# Patient Record
Sex: Male | Born: 1990 | Hispanic: No | Marital: Single
Health system: Southern US, Community
[De-identification: ages and names within clinical notes are randomized; demographics above are authoritative.]

---
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
## 2014-08-03 ENCOUNTER — Emergency Department (HOSPITAL_COMMUNITY)
Admission: EM | Admit: 2014-08-03 | Discharge: 2014-08-03 | Disposition: A | Discharge status: Home / Self Care | Payer: Self-pay | Attending: Emergency Medicine | Admitting: Emergency Medicine

## 2014-08-03 ENCOUNTER — Encounter (HOSPITAL_COMMUNITY): Payer: Self-pay | Admitting: Emergency Medicine

## 2014-08-03 ENCOUNTER — Emergency Department (HOSPITAL_COMMUNITY): Payer: Self-pay

## 2014-08-03 DIAGNOSIS — IMO0002 Reserved for concepts with insufficient information to code with codable children: Secondary | ICD-10-CM

## 2014-08-03 DIAGNOSIS — Y9389 Activity, other specified: Secondary | ICD-10-CM | POA: Insufficient documentation

## 2014-08-03 DIAGNOSIS — Y998 Other external cause status: Secondary | ICD-10-CM | POA: Insufficient documentation

## 2014-08-03 DIAGNOSIS — Z79899 Other long term (current) drug therapy: Secondary | ICD-10-CM | POA: Insufficient documentation

## 2014-08-03 DIAGNOSIS — S2191XA Laceration without foreign body of unspecified part of thorax, initial encounter: Secondary | ICD-10-CM | POA: Insufficient documentation

## 2014-08-03 DIAGNOSIS — S199XXA Unspecified injury of neck, initial encounter: Secondary | ICD-10-CM | POA: Insufficient documentation

## 2014-08-03 DIAGNOSIS — Y9289 Other specified places as the place of occurrence of the external cause: Secondary | ICD-10-CM | POA: Insufficient documentation

## 2014-08-03 DIAGNOSIS — Z23 Encounter for immunization: Secondary | ICD-10-CM | POA: Insufficient documentation

## 2014-08-03 MED ORDER — CEPHALEXIN 500 MG PO CAPS: 500.0000 mg | ORAL_CAPSULE | Freq: Four times a day (QID) | ORAL | Status: DC

## 2014-08-03 MED ORDER — LIDOCAINE-EPINEPHRINE (PF) 2 %-1:200000 IJ SOLN
20.0000 mL | Freq: Once | INTRAMUSCULAR | Status: AC
  Administered 2014-08-03: 10 mL via INTRADERMAL
  Filled 2014-08-03: qty 20

## 2014-08-03 MED ORDER — TETANUS-DIPHTH-ACELL PERTUSSIS 5-2.5-18.5 LF-MCG/0.5 IM SUSP
0.5000 mL | Freq: Once | INTRAMUSCULAR | Status: AC
Start: 2014-08-03 — End: 2014-08-03
  Administered 2014-08-03: 0.5 mL via INTRAMUSCULAR
  Filled 2014-08-03: qty 0.5
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
## 2014-08-03 NOTE — ED Notes (Signed)
Pt placed into gown upon arrival to room. Pt monitored by blood pressure and pulse ox.

## 2014-08-03 NOTE — Discharge Instructions (Signed)
It is important for you to take her antibiotics as prescribed. Please follow-up with [facility], urgent care or the ED within 3 days for a wound recheck. Return to ED for new or worsening symptoms.

## 2014-08-03 NOTE — ED Notes (Signed)
Pt placed on monitor upon arrival to room from radiology. Pt remains monitored by blood pressure and pulse ox.

## 2014-08-03 NOTE — ED Notes (Signed)
Pt remains monitored by blood pressure and pulse ox. [employer] remains at bedside.

## 2018-10-22 ENCOUNTER — Encounter (HOSPITAL_COMMUNITY): Payer: Self-pay | Admitting: Emergency Medicine

## 2018-10-22 ENCOUNTER — Other Ambulatory Visit: Payer: Self-pay

## 2018-10-22 ENCOUNTER — Emergency Department (HOSPITAL_COMMUNITY)
Admission: EM | Admit: 2018-10-22 | Discharge: 2018-10-23 | Disposition: A | Discharge status: Home / Self Care | Payer: Self-pay | Attending: Emergency Medicine | Admitting: Emergency Medicine

## 2018-10-22 DIAGNOSIS — F10920 Alcohol use, unspecified with intoxication, uncomplicated: Secondary | ICD-10-CM

## 2018-10-22 DIAGNOSIS — S60511A Abrasion of right hand, initial encounter: Secondary | ICD-10-CM | POA: Insufficient documentation

## 2018-10-22 DIAGNOSIS — Y999 Unspecified external cause status: Secondary | ICD-10-CM | POA: Insufficient documentation

## 2018-10-22 DIAGNOSIS — W19XXXA Unspecified fall, initial encounter: Secondary | ICD-10-CM | POA: Insufficient documentation

## 2018-10-22 DIAGNOSIS — Y929 Unspecified place or not applicable: Secondary | ICD-10-CM | POA: Insufficient documentation

## 2018-10-22 DIAGNOSIS — F10929 Alcohol use, unspecified with intoxication, unspecified: Secondary | ICD-10-CM | POA: Insufficient documentation

## 2018-10-22 DIAGNOSIS — S60512A Abrasion of left hand, initial encounter: Secondary | ICD-10-CM | POA: Insufficient documentation

## 2018-10-22 DIAGNOSIS — Y939 Activity, unspecified: Secondary | ICD-10-CM | POA: Insufficient documentation
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
## 2018-10-23 NOTE — Discharge Instructions (Signed)
Thank you for allowing me to care for you today in the Emergency Department.   Keep the wounds on your hands clean with warm soap and water.  You can apply a topical antibiotic such as bacitracin.  Use caution when drinking alcohol.  Return to the emergency department if you develop fever, chills, if the wounds on your hands are unable to stop bleeding, if you fall and hit your head while drinking alcohol, if you have a seizure, or other new, concerning symptoms.

## 2018-10-23 NOTE — ED Notes (Signed)
Pt ambulatory with steady gait, no assistance from this NT. Pt tolerated well and stated he felt fine.

## 2019-04-04 ENCOUNTER — Other Ambulatory Visit: Payer: Self-pay

## 2019-04-04 ENCOUNTER — Encounter (HOSPITAL_COMMUNITY): Payer: Self-pay | Admitting: Emergency Medicine

## 2019-04-04 ENCOUNTER — Emergency Department (HOSPITAL_COMMUNITY)
Admission: EM | Admit: 2019-04-04 | Discharge: 2019-04-04 | Disposition: A | Discharge status: Home / Self Care | Payer: Self-pay | Attending: Emergency Medicine | Admitting: Emergency Medicine

## 2019-04-04 ENCOUNTER — Emergency Department (HOSPITAL_COMMUNITY): Payer: Self-pay

## 2019-04-04 DIAGNOSIS — Y999 Unspecified external cause status: Secondary | ICD-10-CM | POA: Insufficient documentation

## 2019-04-04 DIAGNOSIS — Y939 Activity, unspecified: Secondary | ICD-10-CM | POA: Insufficient documentation

## 2019-04-04 DIAGNOSIS — F1210 Cannabis abuse, uncomplicated: Secondary | ICD-10-CM | POA: Insufficient documentation

## 2019-04-04 DIAGNOSIS — S6991XA Unspecified injury of right wrist, hand and finger(s), initial encounter: Secondary | ICD-10-CM | POA: Insufficient documentation

## 2019-04-04 DIAGNOSIS — X509XXA Other and unspecified overexertion or strenuous movements or postures, initial encounter: Secondary | ICD-10-CM | POA: Insufficient documentation

## 2019-04-04 DIAGNOSIS — F1410 Cocaine abuse, uncomplicated: Secondary | ICD-10-CM | POA: Insufficient documentation

## 2019-04-04 DIAGNOSIS — Y929 Unspecified place or not applicable: Secondary | ICD-10-CM | POA: Insufficient documentation

## 2019-04-04 MED ORDER — IBUPROFEN 800 MG PO TABS: 800.0000 mg | ORAL_TABLET | Freq: Three times a day (TID) | ORAL | 0 refills | Status: DC | PRN

## 2019-04-04 NOTE — Discharge Instructions (Signed)
Return here as needed.  Your x-rays did not show any abnormalities at this time.  Ice and elevate the wrist.
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
## 2019-08-07 ENCOUNTER — Other Ambulatory Visit: Payer: Self-pay

## 2019-08-07 ENCOUNTER — Emergency Department (HOSPITAL_COMMUNITY): Payer: No Typology Code available for payment source

## 2019-08-07 ENCOUNTER — Encounter (HOSPITAL_COMMUNITY): Payer: Self-pay | Admitting: Emergency Medicine

## 2019-08-07 ENCOUNTER — Emergency Department (HOSPITAL_COMMUNITY)
Admission: EM | Admit: 2019-08-07 | Discharge: 2019-08-07 | Disposition: A | Discharge status: Home / Self Care | Payer: No Typology Code available for payment source | Attending: Emergency Medicine | Admitting: Emergency Medicine

## 2019-08-07 DIAGNOSIS — Y929 Unspecified place or not applicable: Secondary | ICD-10-CM | POA: Insufficient documentation

## 2019-08-07 DIAGNOSIS — S60512A Abrasion of left hand, initial encounter: Secondary | ICD-10-CM | POA: Diagnosis not present

## 2019-08-07 DIAGNOSIS — Z23 Encounter for immunization: Secondary | ICD-10-CM | POA: Diagnosis not present

## 2019-08-07 DIAGNOSIS — S01312A Laceration without foreign body of left ear, initial encounter: Secondary | ICD-10-CM | POA: Diagnosis not present

## 2019-08-07 DIAGNOSIS — R410 Disorientation, unspecified: Secondary | ICD-10-CM | POA: Insufficient documentation

## 2019-08-07 DIAGNOSIS — Y939 Activity, unspecified: Secondary | ICD-10-CM | POA: Insufficient documentation

## 2019-08-07 DIAGNOSIS — S80211A Abrasion, right knee, initial encounter: Secondary | ICD-10-CM | POA: Insufficient documentation

## 2019-08-07 DIAGNOSIS — S0101XA Laceration without foreign body of scalp, initial encounter: Secondary | ICD-10-CM | POA: Diagnosis not present

## 2019-08-07 DIAGNOSIS — R52 Pain, unspecified: Secondary | ICD-10-CM | POA: Insufficient documentation

## 2019-08-07 DIAGNOSIS — Y999 Unspecified external cause status: Secondary | ICD-10-CM | POA: Insufficient documentation

## 2019-08-07 DIAGNOSIS — F1290 Cannabis use, unspecified, uncomplicated: Secondary | ICD-10-CM | POA: Diagnosis not present

## 2019-08-07 DIAGNOSIS — S01511A Laceration without foreign body of lip, initial encounter: Secondary | ICD-10-CM | POA: Diagnosis not present

## 2019-08-07 DIAGNOSIS — S80212A Abrasion, left knee, initial encounter: Secondary | ICD-10-CM | POA: Insufficient documentation

## 2019-08-07 DIAGNOSIS — R188 Other ascites: Secondary | ICD-10-CM | POA: Diagnosis not present

## 2019-08-07 DIAGNOSIS — T07XXXA Unspecified multiple injuries, initial encounter: Secondary | ICD-10-CM | POA: Diagnosis present

## 2019-08-07 LAB — URINALYSIS, ROUTINE W REFLEX MICROSCOPIC
Bacteria, UA: NONE SEEN
Bilirubin Urine: NEGATIVE
Glucose, UA: NEGATIVE mg/dL
Ketones, ur: NEGATIVE mg/dL
Leukocytes,Ua: NEGATIVE
Nitrite: NEGATIVE
Protein, ur: NEGATIVE mg/dL
Specific Gravity, Urine: >1.046 — ABNORMAL HIGH (ref 1.005–1.030)
pH: 5 (ref 5.0–8.0)

## 2019-08-07 LAB — CBC
HCT: 50 % (ref 39.0–52.0)
Hemoglobin: 17.1 g/dL — ABNORMAL HIGH (ref 13.0–17.0)
MCH: 31.5 pg (ref 26.0–34.0)
MCHC: 34.2 g/dL (ref 30.0–36.0)
MCV: 92.3 fL (ref 80.0–100.0)
Platelets: 185 10*3/uL (ref 150–400)
RBC: 5.42 MIL/uL (ref 4.22–5.81)
RDW: 11.9 % (ref 11.5–15.5)
WBC: 13.6 10*3/uL — ABNORMAL HIGH (ref 4.0–10.5)
nRBC: 0.1 % (ref 0.0–0.2)

## 2019-08-07 LAB — COMPREHENSIVE METABOLIC PANEL
ALT: 65 U/L — ABNORMAL HIGH (ref 0–44)
AST: 67 U/L — ABNORMAL HIGH (ref 15–41)
Albumin: 4.3 g/dL (ref 3.5–5.0)
Alkaline Phosphatase: 69 U/L (ref 38–126)
Anion gap: 8 (ref 5–15)
BUN: 13 mg/dL (ref 6–20)
CO2: 22 mmol/L (ref 22–32)
Calcium: 9.2 mg/dL (ref 8.9–10.3)
Chloride: 105 mmol/L (ref 98–111)
Creatinine, Ser: 0.85 mg/dL (ref 0.61–1.24)
GFR calc Af Amer: >60 mL/min (ref >60)
GFR calc non Af Amer: >60 mL/min (ref >60)
Glucose, Bld: 143 mg/dL — ABNORMAL HIGH (ref 70–99)
Potassium: 4 mmol/L (ref 3.5–5.1)
Sodium: 135 mmol/L (ref 135–145)
Total Bilirubin: 1.3 mg/dL — ABNORMAL HIGH (ref 0.3–1.2)
Total Protein: 6.8 g/dL (ref 6.5–8.1)

## 2019-08-07 LAB — RAPID URINE DRUG SCREEN, HOSP PERFORMED
Amphetamines: NOT DETECTED
Barbiturates: NOT DETECTED
Benzodiazepines: NOT DETECTED
Cocaine: NOT DETECTED
Opiates: NOT DETECTED
Tetrahydrocannabinol: POSITIVE — AB

## 2019-08-07 LAB — ETHANOL: Alcohol, Ethyl (B): <10 mg/dL (ref <10)

## 2019-08-07 LAB — LACTIC ACID, PLASMA: Lactic Acid, Venous: 2.2 mmol/L (ref 0.5–1.9)

## 2019-08-07 LAB — CDS SEROLOGY

## 2019-08-07 MED ORDER — LIDOCAINE HCL (PF) 1 % IJ SOLN
5.0000 mL | Freq: Once | INTRAMUSCULAR | Status: AC
  Administered 2019-08-07: 5 mL
  Filled 2019-08-07: qty 5

## 2019-08-07 MED ORDER — TETANUS-DIPHTH-ACELL PERTUSSIS 5-2.5-18.5 LF-MCG/0.5 IM SUSP
0.5000 mL | Freq: Once | INTRAMUSCULAR | Status: AC
  Administered 2019-08-07: 0.5 mL via INTRAMUSCULAR
  Filled 2019-08-07: qty 0.5

## 2019-08-07 MED ORDER — CEPHALEXIN 500 MG PO CAPS: 500.0000 mg | ORAL_CAPSULE | Freq: Three times a day (TID) | ORAL | 0 refills | Status: AC

## 2019-08-07 MED ORDER — FENTANYL CITRATE (PF) 100 MCG/2ML IJ SOLN
100.0000 ug | Freq: Once | INTRAMUSCULAR | Status: AC
  Administered 2019-08-07: 100 ug via INTRAVENOUS
  Filled 2019-08-07: qty 2

## 2019-08-07 MED ORDER — ACETAMINOPHEN 500 MG PO TABS
1000.0000 mg | ORAL_TABLET | Freq: Once | ORAL | Status: AC
  Administered 2019-08-07: 1000 mg via ORAL
  Filled 2019-08-07: qty 2

## 2019-08-07 MED ORDER — ONDANSETRON HCL 4 MG/2ML IJ SOLN
4.0000 mg | Freq: Once | INTRAMUSCULAR | Status: AC
  Administered 2019-08-07: 4 mg via INTRAVENOUS
  Filled 2019-08-07: qty 2

## 2019-08-07 MED ORDER — CYCLOBENZAPRINE HCL 10 MG PO TABS: 10.0000 mg | ORAL_TABLET | Freq: Two times a day (BID) | ORAL | 0 refills | Status: DC | PRN

## 2019-08-07 MED ORDER — SODIUM CHLORIDE 0.9 % IV BOLUS
1000.0000 mL | Freq: Once | INTRAVENOUS | Status: AC
  Administered 2019-08-07: 1000 mL via INTRAVENOUS

## 2019-08-07 MED ORDER — IBUPROFEN 600 MG PO TABS: 600.0000 mg | ORAL_TABLET | Freq: Four times a day (QID) | ORAL | 0 refills | Status: DC | PRN

## 2019-08-07 MED ORDER — IOHEXOL 300 MG/ML  SOLN
100.0000 mL | Freq: Once | INTRAMUSCULAR | Status: AC | PRN
  Administered 2019-08-07: 100 mL via INTRAVENOUS

## 2019-08-07 MED ORDER — LIDOCAINE HCL 2 % IJ SOLN
10.0000 mL | Freq: Once | INTRAMUSCULAR | Status: AC
  Administered 2019-08-07: 200 mg via INTRADERMAL
  Filled 2019-08-07: qty 20

## 2019-08-07 MED ORDER — CEPHALEXIN 250 MG PO CAPS
500.0000 mg | ORAL_CAPSULE | Freq: Once | ORAL | Status: AC
  Administered 2019-08-07: 500 mg via ORAL
  Filled 2019-08-07: qty 2
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
## 2019-08-07 NOTE — Discharge Instructions (Addendum)
Please take all of your antibiotics until finished!   Take your antibiotics with food.  Common side effects of antibiotics include nausea, vomiting, abdominal discomfort, and diarrhea. You may help offset some of this with probiotics which you can buy or get in yogurt. Do not eat  or take the probiotics until 2 hours after your antibiotic.    You can take 1 to 2 tablets of Tylenol (350mg -1000mg  depending on the dose) every 6 hours as needed for pain.  Do not exceed 4000 mg of Tylenol daily.  If your pain persists you can take a doses of ibuprofen in between doses of Tylenol.  I usually recommend 400 to 600 mg of ibuprofen every 6 hours.  Take this with food to avoid upset stomach issues.  You can take Flexeril as needed for muscle relaxation.  This medication can cause drowsiness so do not drive, drink alcohol or operate heavy machinery while taking this medication.  Keep your wounds clean and dry.  Do not apply any ointments.  You can wash with antibacterial soap and warm water.  You will need to return in 5 days for suture removal of the sutures in the scalp in the left ear.  Please return sooner if any concerning signs or symptoms develop such as fevers, severe swelling, abnormal drainage, redness or streaking of redness around the wounds, persistent vomiting or loss of consciousness.

## 2019-08-08 ENCOUNTER — Encounter (HOSPITAL_COMMUNITY): Payer: Self-pay | Admitting: Emergency Medicine

## 2019-08-08 LAB — SAMPLE TO BLOOD BANK

## 2021-03-30 ENCOUNTER — Inpatient Hospital Stay (HOSPITAL_COMMUNITY)
Admission: EM | Admit: 2021-03-30 | Discharge: 2021-04-01 | DRG: 683 | Disposition: A | Discharge status: Home / Self Care | Payer: Self-pay | Attending: Internal Medicine | Admitting: Internal Medicine

## 2021-03-30 ENCOUNTER — Other Ambulatory Visit: Payer: Self-pay

## 2021-03-30 ENCOUNTER — Emergency Department (HOSPITAL_COMMUNITY): Payer: Self-pay

## 2021-03-30 DIAGNOSIS — M6282 Rhabdomyolysis: Secondary | ICD-10-CM | POA: Diagnosis present

## 2021-03-30 DIAGNOSIS — E860 Dehydration: Secondary | ICD-10-CM

## 2021-03-30 DIAGNOSIS — N179 Acute kidney failure, unspecified: Principal | ICD-10-CM

## 2021-03-30 DIAGNOSIS — R739 Hyperglycemia, unspecified: Secondary | ICD-10-CM | POA: Diagnosis present

## 2021-03-30 DIAGNOSIS — X30XXXA Exposure to excessive natural heat, initial encounter: Secondary | ICD-10-CM

## 2021-03-30 DIAGNOSIS — W19XXXA Unspecified fall, initial encounter: Secondary | ICD-10-CM | POA: Diagnosis present

## 2021-03-30 DIAGNOSIS — Z20822 Contact with and (suspected) exposure to covid-19: Secondary | ICD-10-CM | POA: Diagnosis present

## 2021-03-30 DIAGNOSIS — D72829 Elevated white blood cell count, unspecified: Secondary | ICD-10-CM | POA: Diagnosis present

## 2021-03-30 DIAGNOSIS — T675XXA Heat exhaustion, unspecified, initial encounter: Secondary | ICD-10-CM | POA: Diagnosis present

## 2021-03-30 DIAGNOSIS — R112 Nausea with vomiting, unspecified: Secondary | ICD-10-CM

## 2021-03-30 LAB — RESP PANEL BY RT-PCR (FLU A&B, COVID) ARPGX2
Influenza A by PCR: NEGATIVE
Influenza B by PCR: NEGATIVE
SARS Coronavirus 2 by RT PCR: NEGATIVE

## 2021-03-30 LAB — CBC WITH DIFFERENTIAL/PLATELET
Abs Immature Granulocytes: 0.12 10*3/uL — ABNORMAL HIGH (ref 0.00–0.07)
Basophils Absolute: 0.1 10*3/uL (ref 0.0–0.1)
Basophils Relative: 0 %
Eosinophils Absolute: 0 10*3/uL (ref 0.0–0.5)
Eosinophils Relative: 0 %
HCT: 56 % — ABNORMAL HIGH (ref 39.0–52.0)
Hemoglobin: 20.1 g/dL — ABNORMAL HIGH (ref 13.0–17.0)
Immature Granulocytes: 1 %
Lymphocytes Relative: 5 %
Lymphs Abs: 0.9 10*3/uL (ref 0.7–4.0)
MCH: 32.4 pg (ref 26.0–34.0)
MCHC: 35.9 g/dL (ref 30.0–36.0)
MCV: 90.2 fL (ref 80.0–100.0)
Monocytes Absolute: 0.7 10*3/uL (ref 0.1–1.0)
Monocytes Relative: 4 %
Neutro Abs: 16.9 10*3/uL — ABNORMAL HIGH (ref 1.7–7.7)
Neutrophils Relative %: 90 %
Platelets: 276 10*3/uL (ref 150–400)
RBC: 6.21 MIL/uL — ABNORMAL HIGH (ref 4.22–5.81)
RDW: 12.4 % (ref 11.5–15.5)
WBC: 18.7 10*3/uL — ABNORMAL HIGH (ref 4.0–10.5)
nRBC: 0 % (ref 0.0–0.2)

## 2021-03-30 LAB — COMPREHENSIVE METABOLIC PANEL
ALT: 33 U/L (ref 0–44)
AST: 36 U/L (ref 15–41)
Albumin: 6.8 g/dL — ABNORMAL HIGH (ref 3.5–5.0)
Alkaline Phosphatase: 101 U/L (ref 38–126)
Anion gap: 24 — ABNORMAL HIGH (ref 5–15)
BUN: 42 mg/dL — ABNORMAL HIGH (ref 6–20)
CO2: 15 mmol/L — ABNORMAL LOW (ref 22–32)
Calcium: 11.5 mg/dL — ABNORMAL HIGH (ref 8.9–10.3)
Chloride: 97 mmol/L — ABNORMAL LOW (ref 98–111)
Creatinine, Ser: 3.38 mg/dL — ABNORMAL HIGH (ref 0.61–1.24)
GFR, Estimated: 24 mL/min — ABNORMAL LOW (ref >60)
Glucose, Bld: 159 mg/dL — ABNORMAL HIGH (ref 70–99)
Potassium: 4.7 mmol/L (ref 3.5–5.1)
Sodium: 136 mmol/L (ref 135–145)
Total Bilirubin: 1.9 mg/dL — ABNORMAL HIGH (ref 0.3–1.2)
Total Protein: 11 g/dL — ABNORMAL HIGH (ref 6.5–8.1)

## 2021-03-30 LAB — I-STAT CHEM 8, ED
BUN: 39 mg/dL — ABNORMAL HIGH (ref 6–20)
Calcium, Ion: 1.05 mmol/L — ABNORMAL LOW (ref 1.15–1.40)
Chloride: 105 mmol/L (ref 98–111)
Creatinine, Ser: 3.6 mg/dL — ABNORMAL HIGH (ref 0.61–1.24)
Glucose, Bld: 163 mg/dL — ABNORMAL HIGH (ref 70–99)
HCT: 61 % — ABNORMAL HIGH (ref 39.0–52.0)
Hemoglobin: 20.7 g/dL — ABNORMAL HIGH (ref 13.0–17.0)
Potassium: 4.5 mmol/L (ref 3.5–5.1)
Sodium: 134 mmol/L — ABNORMAL LOW (ref 135–145)
TCO2: 18 mmol/L — ABNORMAL LOW (ref 22–32)

## 2021-03-30 LAB — CK: Total CK: 524 U/L — ABNORMAL HIGH (ref 49–397)

## 2021-03-30 LAB — PROTIME-INR
INR: 0.9 (ref 0.8–1.2)
Prothrombin Time: 12.3 seconds (ref 11.4–15.2)

## 2021-03-30 LAB — ETHANOL: Alcohol, Ethyl (B): <10 mg/dL (ref <10)

## 2021-03-30 LAB — LIPASE, BLOOD: Lipase: 26 U/L (ref 11–51)

## 2021-03-30 MED ORDER — ONDANSETRON 4 MG PO TBDP
4.0000 mg | ORAL_TABLET | Freq: Once | ORAL | Status: AC
  Administered 2021-03-30: 4 mg via ORAL
  Filled 2021-03-30: qty 1

## 2021-03-30 NOTE — ED Provider Notes (Signed)
Emergency Medicine Provider Triage Evaluation Note  Olaoluwa Grieder , a 30 y.o. male  was evaluated in triage.  Pt complains of feeling weak and vomiting.  He denies any known sick contacts.  He reports no abdominal pain, rather that his entire body hurts.  He reports that he got weak while taking a shower today, and that he fell and struck his head.    He states that he did not fully pass out.   Review of Systems  Positive: Body pain, feeling globally weak, vomiting, Negative: Fevers  Physical Exam  BP 124/90 (BP Location: Left Arm)   Pulse 82   Temp 97.9 F (36.6 C) (Oral)   Resp 18   Ht 5\' 7"  (1.702 m)   Wt 63.5 kg   SpO2 100%   BMI 21.93 kg/m  Gen:   Awake, dry heaving, unable to speak in complete sentences due to dry heaving.  Resp:  Normal effort  MSK:   Moves extremities without difficulty, able to lift bilateral legs off wheel chair, moves arms bilaterally.  Other:  Appears to feel unwell.   Medical Decision Making  Medically screening exam initiated at 9:19 PM.  Appropriate orders placed.  Abdulahi Palacio-Garcia was informed that the remainder of the evaluation will be completed by another provider, this initial triage assessment does not replace that evaluation, and the importance of remaining in the ED until their evaluation is complete.  Labs and EKG ordered.  Will order covid test given body aches.     Jonetta Speak 03/30/21 2132    2133, MD 04/03/21 [id number]

## 2021-03-30 NOTE — ED Triage Notes (Signed)
Pt complains of vomiting and weakness since earlier today. Pt states that he was in the shower and fell and hit his head due to weakness.

## 2021-03-31 ENCOUNTER — Emergency Department (HOSPITAL_COMMUNITY): Payer: Self-pay

## 2021-03-31 DIAGNOSIS — D72829 Elevated white blood cell count, unspecified: Secondary | ICD-10-CM

## 2021-03-31 DIAGNOSIS — R112 Nausea with vomiting, unspecified: Secondary | ICD-10-CM

## 2021-03-31 DIAGNOSIS — E860 Dehydration: Secondary | ICD-10-CM

## 2021-03-31 DIAGNOSIS — N179 Acute kidney failure, unspecified: Secondary | ICD-10-CM | POA: Diagnosis present

## 2021-03-31 DIAGNOSIS — T675XXA Heat exhaustion, unspecified, initial encounter: Secondary | ICD-10-CM

## 2021-03-31 DIAGNOSIS — M6282 Rhabdomyolysis: Secondary | ICD-10-CM

## 2021-03-31 LAB — URINALYSIS, ROUTINE W REFLEX MICROSCOPIC
Bilirubin Urine: NEGATIVE
Glucose, UA: NEGATIVE mg/dL
Hgb urine dipstick: NEGATIVE
Ketones, ur: NEGATIVE mg/dL
Leukocytes,Ua: NEGATIVE
Nitrite: NEGATIVE
Protein, ur: NEGATIVE mg/dL
Specific Gravity, Urine: 1.025 (ref 1.005–1.030)
pH: 6 (ref 5.0–8.0)

## 2021-03-31 LAB — RAPID URINE DRUG SCREEN, HOSP PERFORMED
Amphetamines: NOT DETECTED
Barbiturates: NOT DETECTED
Benzodiazepines: NOT DETECTED
Cocaine: POSITIVE — AB
Opiates: NOT DETECTED
Tetrahydrocannabinol: POSITIVE — AB

## 2021-03-31 LAB — BASIC METABOLIC PANEL
Anion gap: 10 (ref 5–15)
BUN: 47 mg/dL — ABNORMAL HIGH (ref 6–20)
CO2: 22 mmol/L (ref 22–32)
Calcium: 9.3 mg/dL (ref 8.9–10.3)
Chloride: 105 mmol/L (ref 98–111)
Creatinine, Ser: 2.15 mg/dL — ABNORMAL HIGH (ref 0.61–1.24)
GFR, Estimated: 42 mL/min — ABNORMAL LOW (ref >60)
Glucose, Bld: 139 mg/dL — ABNORMAL HIGH (ref 70–99)
Potassium: 4 mmol/L (ref 3.5–5.1)
Sodium: 137 mmol/L (ref 135–145)

## 2021-03-31 LAB — CBC
HCT: 47.6 % (ref 39.0–52.0)
Hemoglobin: 16.6 g/dL (ref 13.0–17.0)
MCH: 31.8 pg (ref 26.0–34.0)
MCHC: 34.9 g/dL (ref 30.0–36.0)
MCV: 91.2 fL (ref 80.0–100.0)
Platelets: 226 10*3/uL (ref 150–400)
RBC: 5.22 MIL/uL (ref 4.22–5.81)
RDW: 12.6 % (ref 11.5–15.5)
WBC: 12.7 10*3/uL — ABNORMAL HIGH (ref 4.0–10.5)
nRBC: 0 % (ref 0.0–0.2)

## 2021-03-31 LAB — CK: Total CK: 709 U/L — ABNORMAL HIGH (ref 49–397)

## 2021-03-31 LAB — HIV ANTIBODY (ROUTINE TESTING W REFLEX): HIV Screen 4th Generation wRfx: NONREACTIVE

## 2021-03-31 MED ORDER — ACETAMINOPHEN 325 MG PO TABS: 650.0000 mg | ORAL_TABLET | Freq: Four times a day (QID) | ORAL | Status: DC | PRN

## 2021-03-31 MED ORDER — SODIUM CHLORIDE 0.9 % IV SOLN: Freq: Once | INTRAVENOUS | Status: AC

## 2021-03-31 MED ORDER — ONDANSETRON HCL 4 MG/2ML IJ SOLN: 4.0000 mg | Freq: Four times a day (QID) | INTRAMUSCULAR | Status: DC | PRN

## 2021-03-31 MED ORDER — ACETAMINOPHEN 650 MG RE SUPP: 650.0000 mg | Freq: Four times a day (QID) | RECTAL | Status: DC | PRN

## 2021-03-31 MED ORDER — TRAZODONE HCL 50 MG PO TABS: 25.0000 mg | ORAL_TABLET | Freq: Every evening | ORAL | Status: DC | PRN

## 2021-03-31 MED ORDER — MAGNESIUM HYDROXIDE 400 MG/5ML PO SUSP: 30.0000 mL | Freq: Every day | ORAL | Status: DC | PRN

## 2021-03-31 MED ORDER — ONDANSETRON HCL 4 MG PO TABS: 4.0000 mg | ORAL_TABLET | Freq: Four times a day (QID) | ORAL | Status: DC | PRN

## 2021-03-31 MED ORDER — HALOPERIDOL LACTATE 5 MG/ML IJ SOLN
2.0000 mg | Freq: Once | INTRAMUSCULAR | Status: AC
  Administered 2021-03-31: 2 mg via INTRAVENOUS
  Filled 2021-03-31: qty 1

## 2021-03-31 MED ORDER — SODIUM CHLORIDE 0.9 % IV SOLN: INTRAVENOUS | Status: DC

## 2021-03-31 MED ORDER — ENOXAPARIN SODIUM 30 MG/0.3ML IJ SOSY
30.0000 mg | PREFILLED_SYRINGE | INTRAMUSCULAR | Status: DC
  Administered 2021-03-31: 30 mg via SUBCUTANEOUS
  Filled 2021-03-31: qty 0.3

## 2021-03-31 MED ORDER — ENOXAPARIN SODIUM 40 MG/0.4ML IJ SOSY
40.0000 mg | PREFILLED_SYRINGE | INTRAMUSCULAR | Status: DC
  Administered 2021-04-01: 40 mg via SUBCUTANEOUS
  Filled 2021-03-31: qty 0.4

## 2021-03-31 NOTE — ED Notes (Signed)
Patient is ready for transport.  
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
## 2021-03-31 NOTE — ED Provider Notes (Signed)
[facility]-EMERGENCY DEPT Provider Note   CSN: [id number] Arrival date & time: 03/30/21  2027     History Chief Complaint  Patient presents with   Emesis   Weakness    Stephen Heath is a 30 y.o. male.  The history is provided by the patient.  Emesis Severity:  Moderate Duration:  1 day Timing:  Intermittent Number of daily episodes:  9 Quality:  Stomach contents Progression:  Unchanged Chronicity:  New Recent urination:  Normal Context: not post-tussive   Relieved by:  Nothing Worsened by:  Nothing Ineffective treatments:  None tried Associated symptoms: myalgias   Associated symptoms: no abdominal pain, no arthralgias, no chills, no diarrhea, no fever, no sore throat and no URI   Risk factors: no alcohol use   Patient reports 9 times emesis, no diarrhea. 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      No past medical history on file.  There are no problems to display for this patient.   No past surgical history on file.     No family history on file.  Social History   Tobacco Use   Smoking status: Never  Substance Use Topics   Alcohol use: Yes   Drug use: Yes    Types: Cocaine, Marijuana    Home Medications Prior to Admission medications   Medication Sig Start Date End Date Taking? Authorizing Provider  cephALEXin (KEFLEX) 500 MG capsule Take 1 capsule (500 mg total) by mouth 4 (four) times daily. 08/03/14   Cartner, Sharlet Salina, PA-C  cyclobenzaprine (FLEXERIL) 10 MG tablet Take 1 tablet (10 mg total) by mouth 2 (two) times daily as needed for muscle spasms. 08/07/19   Fawze, Mina A, PA-C  ibuprofen (ADVIL) 600 MG tablet Take 1 tablet (600 mg total) by mouth every 6 (six) hours as needed. 08/07/19   Fawze, Mina A, PA-C  ibuprofen (ADVIL) 800 MG tablet Take 1 tablet (800 mg total) by mouth every 8 (eight) hours as needed. 04/04/19   Charlestine Night, PA-C    Allergies    Patient has no known allergies.  Review of  Systems   Review of Systems  Constitutional:  Negative for chills and fever.  HENT:  Negative for sore throat.   Eyes:  Negative for redness.  Respiratory:  Negative for wheezing and stridor.   Cardiovascular:  Negative for chest pain.  Gastrointestinal:  Positive for nausea and vomiting. Negative for abdominal pain and diarrhea.  Genitourinary:  Negative for dysuria.  Musculoskeletal:  Positive for myalgias. Negative for arthralgias.  Skin:  Negative for rash.  Neurological:  Negative for facial asymmetry.  Psychiatric/Behavioral:  Negative for agitation.   All other systems reviewed and are negative.  Physical Exam Updated Vital Signs BP (!) 140/92 (BP Location: Left Arm)   Pulse 80   Temp 97.9 F (36.6 C) (Oral)   Resp 16   Ht 5\' 7"  (1.702 m)   Wt 63.5 kg   SpO2 99%   BMI 21.93 kg/m   Physical Exam Vitals and nursing note reviewed.  Constitutional:      General: He is not in acute distress.    Appearance: Normal appearance.  HENT:     Head: Normocephalic and atraumatic.     Nose: Nose normal.  Eyes:     Conjunctiva/sclera: Conjunctivae normal.     Pupils: Pupils are equal, round, and reactive to light.  Cardiovascular:     Rate and Rhythm: Normal rate and regular rhythm.  Pulses: Normal pulses.     Heart sounds: Normal heart sounds.  Pulmonary:     Effort: Pulmonary effort is normal.     Breath sounds: Normal breath sounds.  Abdominal:     General: Abdomen is flat. Bowel sounds are normal.     Palpations: Abdomen is soft.     Tenderness: There is no abdominal tenderness. There is no guarding.  Musculoskeletal:        General: Normal range of motion.     Cervical back: Normal range of motion and neck supple.  Skin:    General: Skin is warm and dry.     Capillary Refill: Capillary refill takes less than 2 seconds.  Neurological:     General: No focal deficit present.     Mental Status: He is alert and oriented to person, place, and time.     Deep Tendon  Reflexes: Reflexes normal.  Psychiatric:        Mood and Affect: Mood normal.        Behavior: Behavior normal.    ED Results / Procedures / Treatments   Labs (all labs ordered are listed, but only abnormal results are displayed) Results for orders placed or performed during the hospital encounter of 03/30/21  Resp Panel by RT-PCR (Flu A&B, Covid) Nasopharyngeal Swab   Specimen: Nasopharyngeal Swab; Nasopharyngeal(NP) swabs in vial transport medium  Result Value Ref Range   SARS Coronavirus 2 by RT PCR NEGATIVE NEGATIVE   Influenza A by PCR NEGATIVE NEGATIVE   Influenza B by PCR NEGATIVE NEGATIVE  Comprehensive metabolic panel  Result Value Ref Range   Sodium 136 135 - 145 mmol/L   Potassium 4.7 3.5 - 5.1 mmol/L   Chloride 97 (L) 98 - 111 mmol/L   CO2 15 (L) 22 - 32 mmol/L   Glucose, Bld 159 (H) 70 - 99 mg/dL   BUN 42 (H) 6 - 20 mg/dL   Creatinine, Ser 5.00 (H) 0.61 - 1.24 mg/dL   Calcium 93.8 (H) 8.9 - 10.3 mg/dL   Total Protein 18.2 (H) 6.5 - 8.1 g/dL   Albumin 6.8 (H) 3.5 - 5.0 g/dL   AST 36 15 - 41 U/L   ALT 33 0 - 44 U/L   Alkaline Phosphatase 101 38 - 126 U/L   Total Bilirubin 1.9 (H) 0.3 - 1.2 mg/dL   GFR, Estimated 24 (L) >60 mL/min   Anion gap 24 (H) 5 - 15  Lipase, blood  Result Value Ref Range   Lipase 26 11 - 51 U/L  CBC with Differential  Result Value Ref Range   WBC 18.7 (H) 4.0 - 10.5 K/uL   RBC 6.21 (H) 4.22 - 5.81 MIL/uL   Hemoglobin 20.1 (H) 13.0 - 17.0 g/dL   HCT 99.3 (H) 71.6 - 96.7 %   MCV 90.2 80.0 - 100.0 fL   MCH 32.4 26.0 - 34.0 pg   MCHC 35.9 30.0 - 36.0 g/dL   RDW 89.3 81.0 - 17.5 %   Platelets 276 150 - 400 K/uL   nRBC 0.0 0.0 - 0.2 %   Neutrophils Relative % 90 %   Neutro Abs 16.9 (H) 1.7 - 7.7 K/uL   Lymphocytes Relative 5 %   Lymphs Abs 0.9 0.7 - 4.0 K/uL   Monocytes Relative 4 %   Monocytes Absolute 0.7 0.1 - 1.0 K/uL   Eosinophils Relative 0 %   Eosinophils Absolute 0.0 0.0 - 0.5 K/uL   Basophils Relative 0 %   Basophils  Absolute 0.1 0.0 - 0.1 K/uL   Immature Granulocytes 1 %   Abs Immature Granulocytes 0.12 (H) 0.00 - 0.07 K/uL  Ethanol  Result Value Ref Range   Alcohol, Ethyl (B) <10 <10 mg/dL  CK  Result Value Ref Range   Total CK 524 (H) 49 - 397 U/L  Protime-INR  Result Value Ref Range   Prothrombin Time 12.3 11.4 - 15.2 seconds   INR 0.9 0.8 - 1.2  I-stat chem 8, ED (not at [facility] or [facility])  Result Value Ref Range   Sodium 134 (L) 135 - 145 mmol/L   Potassium 4.5 3.5 - 5.1 mmol/L   Chloride 105 98 - 111 mmol/L   BUN 39 (H) 6 - 20 mg/dL   Creatinine, Ser 6.54 (H) 0.61 - 1.24 mg/dL   Glucose, Bld 650 (H) 70 - 99 mg/dL   Calcium, Ion 3.54 (L) 1.15 - 1.40 mmol/L   TCO2 18 (L) 22 - 32 mmol/L   Hemoglobin 20.7 (H) 13.0 - 17.0 g/dL   HCT 65.6 (H) 81.2 - 75.1 %   CT HEAD WO CONTRAST ( )  Result Date: 03/30/2021 CLINICAL DATA:  Recent head trauma with nausea and vomiting, initial encounter EXAM: CT HEAD WITHOUT CONTRAST TECHNIQUE: Contiguous axial images were obtained from the base of the skull through the vertex without intravenous contrast. COMPARISON:  08/07/2019 FINDINGS: Brain: No evidence of acute infarction, hemorrhage, hydrocephalus, extra-axial collection or mass lesion/mass effect. Cavum septum pellucidum is noted which is a normal variant and stable from the prior exam. Vascular: No hyperdense vessel or unexpected calcification. Skull: Normal. Negative for fracture or focal lesion. Sinuses/Orbits: No acute finding. Other: None. IMPRESSION: No acute intracranial abnormality is noted. Electronically Signed   By: Alcide Clever M.D.   On: 03/30/2021 21:52    EKG None  Radiology CT HEAD WO CONTRAST ( )  Result Date: 03/30/2021 CLINICAL DATA:  Recent head trauma with nausea and vomiting, initial encounter EXAM: CT HEAD WITHOUT CONTRAST TECHNIQUE: Contiguous axial images were obtained from the base of the skull through the vertex without intravenous contrast. COMPARISON:  08/07/2019 FINDINGS:  Brain: No evidence of acute infarction, hemorrhage, hydrocephalus, extra-axial collection or mass lesion/mass effect. Cavum septum pellucidum is noted which is a normal variant and stable from the prior exam. Vascular: No hyperdense vessel or unexpected calcification. Skull: Normal. Negative for fracture or focal lesion. Sinuses/Orbits: No acute finding. Other: None. IMPRESSION: No acute intracranial abnormality is noted. Electronically Signed   By: Alcide Clever M.D.   On: 03/30/2021 21:52    Procedures Procedures   Medications Ordered in ED Medications  haloperidol lactate (HALDOL) injection 2 mg (has no administration in time range)  ondansetron (ZOFRAN-ODT) disintegrating tablet 4 mg (4 mg Oral Given 03/30/21 2152)    ED Course  I have reviewed the triage vital signs and the nursing notes.  Pertinent labs & imaging results that were available during my care of the patient were reviewed by me and considered in my medical decision making (see chart for details).   Stephen Heath was evaluated in Emergency Department on 03/31/2021 for the symptoms described in the history of present illness. He was evaluated in the context of the global COVID-19 pandemic, which necessitated consideration that the patient might be at risk for infection with the SARS-CoV-2 virus that causes COVID-19. Institutional protocols and algorithms that pertain to the evaluation of patients at risk for COVID-19 are in a state of rapid change based on information released by regulatory bodies including  the [employer] and federal and state organizations. These policies and algorithms were followed during the patient's care in the ED.  Final Clinical Impression(s) / ED Diagnoses Final diagnoses:  Dehydration  Non-intractable vomiting with nausea, unspecified vomiting type  AKI (acute kidney injury) (HCC)   Admit to medicine     Rishika Mccollom, MD 03/31/21 5427

## 2021-03-31 NOTE — H&P (Addendum)
[place]   PATIENT NAME: Stephen Heath    MR#:  [id number]  DATE OF BIRTH:  02-21-1991  DATE OF ADMISSION:  03/30/2021  PRIMARY CARE PHYSICIAN: Patient, No Pcp Per (Inactive)   Patient is coming from: Home  REQUESTING/REFERRING PHYSICIAN: Palumbo, April, MD CHIEF COMPLAINT:   Chief Complaint  Patient presents with   Emesis   Weakness    HISTORY OF PRESENT ILLNESS:  Stephen Heath is a 30 y.o. male with no significant medical problems, who presented to the emergency room with acute onset of recurrent nausea and vomiting without bilious vomitus or hematemesis today without abdominal pain or diarrhea or melena or bright red bleeding per rectum.  No chest pain or dyspnea or palpitations.  No cough or wheezing.  He admits to drinking less than a bottle of water per day and he works outside in [employer]. He reports that he got weak while taking a shower today, and that he fell and struck his head.  No loss of consciousness.  ED Course: Upon presenting to the emergency room, the patient abnormal vital signs.  Labs revealed a creatinine of 3.38 and a BUN of 42 5 above previous levels in January 2021.  Calcium of 11.5 and lactic acid was 2.2 with CBC showing leukocytosis at 18.7 and hemoconcentration.  CK was 524 and later 709.  Influenza antigens and COVID-19 PCR came back negative.  Alcohol levels less than 10.   EKG as reviewed by me : Normal sinus rhythm with rate of 96 with biatrial enlargement and RVH. Imaging: Noncontrasted head CT scan revealed no acute intracranial abnormalities.  Two-view abdomen x-ray came back negative.  Noncontrasted head CT scan revealed no acute intracranial normalities.  He was given 4 mg of IV Zofran, 1 L bolus of IV normal saline and 2 mg of IV Haldol.  He will be admitted to a medical bed for further evaluation and management PAST MEDICAL HISTORY:  No past medical history on file.No previous medical problems  PAST SURGICAL HISTORY:   No past surgical history on file.  No previous surgeries.  SOCIAL HISTORY:   Social History   Tobacco Use   Smoking status: Never   Smokeless tobacco: Not on file  Substance Use Topics   Alcohol use: Yes    FAMILY HISTORY:  No family history on file.  He denies any familial diseases.  DRUG ALLERGIES:  No Known Allergies  REVIEW OF SYSTEMS:   ROS As per history of present illness. All pertinent systems were reviewed above. Constitutional, HEENT, cardiovascular, respiratory, GI, GU, musculoskeletal, neuro, psychiatric, endocrine, integumentary and hematologic systems were reviewed and are otherwise negative/unremarkable except for positive findings mentioned above in the HPI.   MEDICATIONS AT HOME:   Prior to Admission medications   Medication Sig Start Date End Date Taking? Authorizing Provider  cephALEXin (KEFLEX) 500 MG capsule Take 1 capsule (500 mg total) by mouth 4 (four) times daily. 08/03/14   Cartner, Sharlet Salina, PA-C  cyclobenzaprine (FLEXERIL) 10 MG tablet Take 1 tablet (10 mg total) by mouth 2 (two) times daily as needed for muscle spasms. 08/07/19   Fawze, Mina A, PA-C  ibuprofen (ADVIL) 600 MG tablet Take 1 tablet (600 mg total) by mouth every 6 (six) hours as needed. 08/07/19   Fawze, Mina A, PA-C  ibuprofen (ADVIL) 800 MG tablet Take 1 tablet (800 mg total) by mouth every 8 (eight) hours as needed. 04/04/19   Charlestine Night, PA-C      VITAL  SIGNS:  Blood pressure 123/63, pulse 64, temperature 97.7 F (36.5 C), temperature source Oral, resp. rate 20, height 5\' 7"  (1.702 m), weight 61.7 kg, SpO2 100 %.  PHYSICAL EXAMINATION:  Physical Exam  GENERAL:  30 y.o.-year-old Hispanic male patient lying in the bed with no acute distress.  EYES: Pupils equal, round, reactive to light and accommodation. No scleral icterus. Extraocular muscles intact.  HEENT: Head atraumatic, normocephalic. Oropharynx with slightly dry mucous membranes and tongue and nasopharynx clear.   NECK:  Supple, no jugular venous distention. No thyroid enlargement, no tenderness.  LUNGS: Normal breath sounds bilaterally, no wheezing, rales,rhonchi or crepitation. No use of accessory muscles of respiration.  CARDIOVASCULAR: Regular rate and rhythm, S1, S2 normal. No murmurs, rubs, or gallops.  ABDOMEN: Soft, nondistended, nontender. Bowel sounds present. No organomegaly or mass.  EXTREMITIES: No pedal edema, cyanosis, or clubbing.  NEUROLOGIC: Cranial nerves II through XII are intact. Muscle strength 5/5 in all extremities. Sensation intact. Gait not checked.  PSYCHIATRIC: The patient is alert and oriented x 3.  Normal affect and good eye contact. SKIN: No obvious rash, lesion, or ulcer.   LABORATORY PANEL:   CBC Recent Labs  Lab 03/31/21 0446  WBC 12.7*  HGB 16.6  HCT 47.6  PLT 226   ------------------------------------------------------------------------------------------------------------------  Chemistries  Recent Labs  Lab 03/30/21 2136 03/30/21 2154 03/31/21 0446  NA 136   < > 137  K 4.7   < > 4.0  CL 97*   < > 105  CO2 15*  --  22  GLUCOSE 159*   < > 139*  BUN 42*   < > 47*  CREATININE 3.38*   < > 2.15*  CALCIUM 11.5*  --  9.3  AST 36  --   --   ALT 33  --   --   ALKPHOS 101  --   --   BILITOT 1.9*  --   --    < > = values in this interval not displayed.   ------------------------------------------------------------------------------------------------------------------  Cardiac Enzymes No results for input(s): TROPONINI in the last 168 hours. ------------------------------------------------------------------------------------------------------------------  RADIOLOGY:  CT HEAD WO CONTRAST (04/02/21)  Result Date: 03/30/2021 CLINICAL DATA:  Recent head trauma with nausea and vomiting, initial encounter EXAM: CT HEAD WITHOUT CONTRAST TECHNIQUE: Contiguous axial images were obtained from the base of the skull through the vertex without intravenous contrast.  COMPARISON:  08/07/2019 FINDINGS: Brain: No evidence of acute infarction, hemorrhage, hydrocephalus, extra-axial collection or mass lesion/mass effect. Cavum septum pellucidum is noted which is a normal variant and stable from the prior exam. Vascular: No hyperdense vessel or unexpected calcification. Skull: Normal. Negative for fracture or focal lesion. Sinuses/Orbits: No acute finding. Other: None. IMPRESSION: No acute intracranial abnormality is noted. Electronically Signed   By: 08/09/2019 M.D.   On: 03/30/2021 21:52   DG Abdomen Acute W/Chest  Result Date: 03/31/2021 CLINICAL DATA:  Pain, vomiting EXAM: DG ABDOMEN ACUTE WITH 1 VIEW CHEST COMPARISON:  Chest radiograph 08/03/2014 clinical correlation is also made with 08/07/2019 CT FINDINGS: There is no evidence of dilated bowel loops or free intraperitoneal air. Calcification inferior to the liver and to the right of midline, likely cholelithiasis as seen on the 08/07/2019 CT. Heart size and mediastinal contours are within normal limits. Both lungs are clear. IMPRESSION: Negative abdominal radiographs.  No acute cardiopulmonary disease. Electronically Signed   By: 08/09/2019 M.D.   On: 03/31/2021 00:20      IMPRESSION AND PLAN:  Active Problems:  AKI (acute kidney injury) (HCC)  1.  Acute kidney injury likely prerenal secondary to recurrent nausea and vomiting and volume depletion. - The patient will be admitted to a medical bed. - We will continue hydration with IV normal saline. - We will follow BMP. - We will avoid nephrotoxins.  2.  Mild acute rhabdomyolysis likely secondary to heat exhaustion. - The patient will be hydrated as mentioned above and will follow his CKs.  3.  Hypercalcemia, likely secondary to viral depletion. - We will continue hydration and follow calcium level.  4.  Leukocytosis, likely stress demargination related. - We will follow CBC with hydration.  DVT prophylaxis: Lovenox.  Code Status: full code.   Family Communication:  The plan of care was discussed in details with the patient (and family). I answered all questions. The patient agreed to proceed with the above mentioned plan. Further management will depend upon hospital course. Disposition Plan: Back to previous home environment Consults called: none.  All the records are reviewed and case discussed with ED provider.  Status is: Inpatient  Remains inpatient appropriate because:Ongoing active pain requiring inpatient pain management, Ongoing diagnostic testing needed not appropriate for outpatient work up, Unsafe d/c plan, IV treatments appropriate due to intensity of illness or inability to take PO, and Inpatient level of care appropriate due to severity of illness  Dispo: The patient is from: Home              Anticipated d/c is to: Home              Patient currently is not medically stable to d/c.   Difficult to place patient No  TOTAL TIME TAKING CARE OF THIS PATIENT: 55 minutes.    Hannah Beat M.D on 03/31/2021 at 6:59 AM  [facility]   From 7 PM-7 AM, contact night-coverage [email or web address]  CC: Primary care physician; Patient, No Pcp Per (Inactive)

## 2021-03-31 NOTE — ED Notes (Signed)
Bladder scan showed 0mL of fluid  

## 2021-04-01 LAB — CBC
HCT: 40.3 % (ref 39.0–52.0)
Hemoglobin: 13.8 g/dL (ref 13.0–17.0)
MCH: 32.2 pg (ref 26.0–34.0)
MCHC: 34.2 g/dL (ref 30.0–36.0)
MCV: 93.9 fL (ref 80.0–100.0)
Platelets: 157 10*3/uL (ref 150–400)
RBC: 4.29 MIL/uL (ref 4.22–5.81)
RDW: 12.5 % (ref 11.5–15.5)
WBC: 5.8 10*3/uL (ref 4.0–10.5)
nRBC: 0 % (ref 0.0–0.2)

## 2021-04-01 LAB — COMPREHENSIVE METABOLIC PANEL
ALT: 20 U/L (ref 0–44)
AST: 32 U/L (ref 15–41)
Albumin: 3.9 g/dL (ref 3.5–5.0)
Alkaline Phosphatase: 53 U/L (ref 38–126)
Anion gap: 3 — ABNORMAL LOW (ref 5–15)
BUN: 21 mg/dL — ABNORMAL HIGH (ref 6–20)
CO2: 24 mmol/L (ref 22–32)
Calcium: 8.8 mg/dL — ABNORMAL LOW (ref 8.9–10.3)
Chloride: 111 mmol/L (ref 98–111)
Creatinine, Ser: 0.7 mg/dL (ref 0.61–1.24)
GFR, Estimated: >60 mL/min (ref >60)
Glucose, Bld: 103 mg/dL — ABNORMAL HIGH (ref 70–99)
Potassium: 4.4 mmol/L (ref 3.5–5.1)
Sodium: 138 mmol/L (ref 135–145)
Total Bilirubin: 1.7 mg/dL — ABNORMAL HIGH (ref 0.3–1.2)
Total Protein: 6.3 g/dL — ABNORMAL LOW (ref 6.5–8.1)

## 2021-04-01 LAB — HEMOGLOBIN A1C
Hgb A1c MFr Bld: 5.4 % (ref 4.8–5.6)
Mean Plasma Glucose: 108 mg/dL

## 2021-04-01 LAB — CK: Total CK: 892 U/L — ABNORMAL HIGH (ref 49–397)

## 2021-04-01 MED ORDER — PROMETHAZINE HCL 25 MG PO TABS: 25.0000 mg | ORAL_TABLET | Freq: Four times a day (QID) | ORAL | 0 refills | Status: AC | PRN

## 2021-04-01 NOTE — Discharge Summary (Signed)
Physician Discharge Summary   Patient ID: Stephen Heath MRN: [id number] DOB/AGE: 1990/07/21 30 y.o.  Admit date: 03/30/2021 Discharge date: 04/01/2021  Primary Care Physician: Patient does not currently have PCP   Recommendations for Outpatient Follow-up:  Recommended to stay hydrated  Home Health: None  Equipment/Devices:   Discharge Condition: stable  CODE STATUS: FULL Diet recommendation: Regular diet   Discharge Diagnoses:     AKI (acute kidney injury) (HCC) Profound dehydration, nausea and vomiting Fall with rhabdomyolysis Leukocytosis Hyperglycemia  Consults: None    Allergies:  No Known Allergies   DISCHARGE MEDICATIONS: Allergies as of 04/01/2021   No Known Allergies      Medication List     TAKE these medications    promethazine 25 MG tablet Commonly known as: PHENERGAN Take 1 tablet (25 mg total) by mouth every 6 (six) hours as needed for nausea or vomiting.         Brief H and P: For complete details please refer to admission H and P, but in brief,  Patient is a 30 year old male with no significant medical problems presented to ED with acute onset of recurrent nausea vomiting, no bilious vomiting or hematemesis.  No abdominal pain no diarrhea, hematochezia or melena.  Per patient, he works outside in [employer] and admitted to drinking less than 1 bottle of water per day.  He felt weak and while taking a shower on the day of admission he fell and struck his head, no loss of consciousness. In ED, labs showed creatinine of 3.38, BUN 42.5.  Calcium 11.5, lactic acid 2.2 leukocytosis 18.7.  CK 524, later 709. Patient was admitted for acute kidney injury, rhabdomyolysis, nausea and vomiting.  Hospital Course:   AKI (acute kidney injury) [facility]) -Patient presented with creatinine of 3.38 with nausea and vomiting, dehydration, high temperatures outside -Patient was placed on aggressive IV fluid hydration -Counseled strongly on staying  well-hydrated while on the job outside -Creatinine has improved to 0.7 at the time of discharge.  Nausea and vomiting -Improved, now tolerating solid diet -Lipase 26 -Received Haldol 2 mg IV x1, was placed on IV antiemetics -Currently stable   Fall with rhabdomyolysis -CT head did not show any intracranial bleeding -CK has been trending up however renal function improved, patient continue to stay well-hydrated  Hyperglycemia -CBG 159 at the time of admission-> 163, fasting 139 -He will been A1c 5.4   Leukocytosis -Likely reactive, improving, no acute signs of infection WBC count 5.8    Day of Discharge S: No acute complaints, feels back to his baseline, hoping to go home  BP (!) 116/57 (BP Location: Left Arm)   Pulse (!) 57   Temp 98.7 F (37.1 C) (Oral)   Resp 18   Ht 5\' 7"  (1.702 m)   Wt 61.7 kg   SpO2 100%   BMI 21.30 kg/m   Physical Exam: General: Alert and awake oriented x3 not in any acute distress. CVS: S1-S2 clear no murmur rubs or gallops Chest: CTAB Abdomen: soft nontender, nondistended, normal bowel sounds Extremities: no cyanosis, clubbing or edema noted bilaterally Neuro: moving all 4 extremities spontaneously    Get Medicines reviewed and adjusted: Please take all your medications with you for your next visit with your Primary MD  Please request your Primary MD to go over all hospital tests and procedure/radiological results at the follow up. Please ask your Primary MD to get all Hospital records sent to his/her office.  If you experience worsening of your admission  symptoms, develop shortness of breath, life threatening emergency, suicidal or homicidal thoughts you must seek medical attention immediately by calling 911 or calling your MD immediately  if symptoms less severe.  You must read complete instructions/literature along with all the possible adverse reactions/side effects for all the Medicines you take and that have been prescribed to you.  Take any new Medicines after you have completely understood and accept all the possible adverse reactions/side effects.   Do not drive when taking pain medications.   Do not take more than prescribed Pain, Sleep and Anxiety Medications  Special Instructions: If you have smoked or chewed Tobacco  in the last 2 yrs please stop smoking, stop any regular Alcohol  and or any Recreational drug use.  Wear Seat belts while driving.  Please note  You were cared for by a hospitalist during your hospital stay. Once you are discharged, your primary care physician will handle any further medical issues. Please note that NO REFILLS for any discharge medications will be authorized once you are discharged, as it is imperative that you return to your primary care physician (or establish a relationship with a primary care physician if you do not have one) for your aftercare needs so that they can reassess your need for medications and monitor your lab values.   The results of significant diagnostics from this hospitalization (including imaging, microbiology, ancillary and laboratory) are listed below for reference.      Procedures/Studies:  CT HEAD WO CONTRAST ( )  Result Date: 03/30/2021 CLINICAL DATA:  Recent head trauma with nausea and vomiting, initial encounter EXAM: CT HEAD WITHOUT CONTRAST TECHNIQUE: Contiguous axial images were obtained from the base of the skull through the vertex without intravenous contrast. COMPARISON:  08/07/2019 FINDINGS: Brain: No evidence of acute infarction, hemorrhage, hydrocephalus, extra-axial collection or mass lesion/mass effect. Cavum septum pellucidum is noted which is a normal variant and stable from the prior exam. Vascular: No hyperdense vessel or unexpected calcification. Skull: Normal. Negative for fracture or focal lesion. Sinuses/Orbits: No acute finding. Other: None. IMPRESSION: No acute intracranial abnormality is noted. Electronically Signed   By: Alcide Clever M.D.   On: 03/30/2021 21:52   DG Abdomen Acute W/Chest  Result Date: 03/31/2021 CLINICAL DATA:  Pain, vomiting EXAM: DG ABDOMEN ACUTE WITH 1 VIEW CHEST COMPARISON:  Chest radiograph 08/03/2014 clinical correlation is also made with 08/07/2019 CT FINDINGS: There is no evidence of dilated bowel loops or free intraperitoneal air. Calcification inferior to the liver and to the right of midline, likely cholelithiasis as seen on the 08/07/2019 CT. Heart size and mediastinal contours are within normal limits. Both lungs are clear. IMPRESSION: Negative abdominal radiographs.  No acute cardiopulmonary disease. Electronically Signed   By: Wiliam Ke M.D.   On: 03/31/2021 00:20      LAB RESULTS: Basic Metabolic Panel: Recent Labs  Lab 03/31/21 0446 04/01/21 0504  NA 137 138  K 4.0 4.4  CL 105 111  CO2 22 24  GLUCOSE 139* 103*  BUN 47* 21*  CREATININE 2.15* 0.70  CALCIUM 9.3 8.8*   Liver Function Tests: Recent Labs  Lab 03/30/21 2136 04/01/21 0504  AST 36 32  ALT 33 20  ALKPHOS 101 53  BILITOT 1.9* 1.7*  PROT 11.0* 6.3*  ALBUMIN 6.8* 3.9   Recent Labs  Lab 03/30/21 2136  LIPASE 26   No results for input(s): AMMONIA in the last 168 hours. CBC: Recent Labs  Lab 03/30/21 2136 03/30/21 2154 03/31/21 0446 04/01/21 9371  WBC 18.7*  --  12.7* 5.8  NEUTROABS 16.9*  --   --   --   HGB 20.1*   < > 16.6 13.8  HCT 56.0*   < > 47.6 40.3  MCV 90.2  --  91.2 93.9  PLT 276  --  226 157   < > = values in this interval not displayed.   Cardiac Enzymes: Recent Labs  Lab 03/31/21 0446 04/01/21 0504  CKTOTAL 709* 892*   BNP: Invalid input(s): POCBNP CBG: No results for input(s): GLUCAP in the last 168 hours.     Disposition and Follow-up: Discharge Instructions     Diet general   Complete by: As directed    Discharge instructions   Complete by: As directed    Stay hydrated, drink plenty of fluids (water, gatorade) while working outside   Increase activity  slowly   Complete by: As directed         DISPOSITION: Home   DISCHARGE FOLLOW-UP    Time coordinating discharge:  35 minutes  Signed:   Thad Ranger M.D. [facility] 04/01/2021, 12:48 PM

## 2021-06-07 ENCOUNTER — Encounter (HOSPITAL_COMMUNITY): Payer: Self-pay

## 2021-06-07 ENCOUNTER — Emergency Department (HOSPITAL_COMMUNITY)
Admission: EM | Admit: 2021-06-07 | Discharge: 2021-06-07 | Disposition: A | Discharge status: Home / Self Care | Payer: Self-pay | Attending: Emergency Medicine | Admitting: Emergency Medicine

## 2021-06-07 ENCOUNTER — Emergency Department (HOSPITAL_COMMUNITY): Payer: Self-pay

## 2021-06-07 ENCOUNTER — Ambulatory Visit: Payer: Self-pay

## 2021-06-07 ENCOUNTER — Other Ambulatory Visit: Payer: Self-pay

## 2021-06-07 DIAGNOSIS — K920 Hematemesis: Secondary | ICD-10-CM | POA: Insufficient documentation

## 2021-06-07 DIAGNOSIS — Z20822 Contact with and (suspected) exposure to covid-19: Secondary | ICD-10-CM | POA: Insufficient documentation

## 2021-06-07 LAB — CBC WITH DIFFERENTIAL/PLATELET
Abs Immature Granulocytes: 0.02 10*3/uL (ref 0.00–0.07)
Basophils Absolute: 0 10*3/uL (ref 0.0–0.1)
Basophils Relative: 0 %
Eosinophils Absolute: 0.1 10*3/uL (ref 0.0–0.5)
Eosinophils Relative: 1 %
HCT: 49.2 % (ref 39.0–52.0)
Hemoglobin: 17 g/dL (ref 13.0–17.0)
Immature Granulocytes: 0 %
Lymphocytes Relative: 16 %
Lymphs Abs: 1 10*3/uL (ref 0.7–4.0)
MCH: 31.9 pg (ref 26.0–34.0)
MCHC: 34.6 g/dL (ref 30.0–36.0)
MCV: 92.3 fL (ref 80.0–100.0)
Monocytes Absolute: 0.3 10*3/uL (ref 0.1–1.0)
Monocytes Relative: 5 %
Neutro Abs: 4.5 10*3/uL (ref 1.7–7.7)
Neutrophils Relative %: 78 %
Platelets: 176 10*3/uL (ref 150–400)
RBC: 5.33 MIL/uL (ref 4.22–5.81)
RDW: 11.9 % (ref 11.5–15.5)
WBC: 5.8 10*3/uL (ref 4.0–10.5)
nRBC: 0 % (ref 0.0–0.2)

## 2021-06-07 LAB — RESP PANEL BY RT-PCR (FLU A&B, COVID) ARPGX2
Influenza A by PCR: NEGATIVE
Influenza B by PCR: NEGATIVE
SARS Coronavirus 2 by RT PCR: NEGATIVE

## 2021-06-07 LAB — COMPREHENSIVE METABOLIC PANEL
ALT: 24 U/L (ref 0–44)
AST: 21 U/L (ref 15–41)
Albumin: 4.5 g/dL (ref 3.5–5.0)
Alkaline Phosphatase: 67 U/L (ref 38–126)
Anion gap: 9 (ref 5–15)
BUN: 19 mg/dL (ref 6–20)
CO2: 23 mmol/L (ref 22–32)
Calcium: 9.1 mg/dL (ref 8.9–10.3)
Chloride: 104 mmol/L (ref 98–111)
Creatinine, Ser: 0.76 mg/dL (ref 0.61–1.24)
GFR, Estimated: >60 mL/min (ref >60)
Glucose, Bld: 96 mg/dL (ref 70–99)
Potassium: 3.4 mmol/L — ABNORMAL LOW (ref 3.5–5.1)
Sodium: 136 mmol/L (ref 135–145)
Total Bilirubin: 1.2 mg/dL (ref 0.3–1.2)
Total Protein: 7.4 g/dL (ref 6.5–8.1)

## 2021-06-07 LAB — LIPASE, BLOOD: Lipase: 21 U/L (ref 11–51)

## 2021-06-07 MED ORDER — ONDANSETRON HCL 4 MG/2ML IJ SOLN
4.0000 mg | Freq: Once | INTRAMUSCULAR | Status: AC
  Administered 2021-06-07: 22:00:00 4 mg via INTRAVENOUS
  Filled 2021-06-07: qty 2

## 2021-06-07 MED ORDER — FAMOTIDINE 20 MG PO TABS: 20.0000 mg | ORAL_TABLET | Freq: Two times a day (BID) | ORAL | 2 refills | Status: AC

## 2021-06-07 MED ORDER — ONDANSETRON HCL 4 MG PO TABS: 4.0000 mg | ORAL_TABLET | Freq: Three times a day (TID) | ORAL | 0 refills | Status: AC | PRN

## 2021-06-07 NOTE — Telephone Encounter (Signed)
Patient has had chest pain on the right side off and on. Pt does not know what may be causing this.    Currently pt feels fine. Pt was walking to [facility] to ED. Pt will call friend to pick him up and take him the rest of the way.   Patient called has had 2 bouts of vomiting with blood in the vomit. He thinks this may have been from something he ate.            Answer Assessment - Initial Assessment Questions 1. LOCATION: "Where does it hurt?"       Chest - right side 2. RADIATION: "Does the pain go anywhere else?" (e.g., into neck, jaw, arms, back)     no 3. ONSET: "When did the chest pain begin?" (Minutes, hours or days)      days 4. PATTERN "Does the pain come and go, or has it been constant since it started?"  "Does it get worse with exertion?"      Come and go 5. DURATION: "How long does it last" (e.g., seconds, minutes, hours)     na 6. SEVERITY: "How bad is the pain?"  (e.g., Scale 1-10; mild, moderate, or severe)    - MILD (1-3): doesn't interfere with normal activities     - MODERATE (4-7): interferes with normal activities or awakens from sleep    - SEVERE (8-10): excruciating pain, unable to do any normal activities       na 7. CARDIAC RISK FACTORS: "Do you have any history of heart problems or risk factors for heart disease?" (e.g., angina, prior heart attack; diabetes, high blood pressure, high cholesterol, smoker, or strong family history of heart disease)     no 8. PULMONARY RISK FACTORS: "Do you have any history of lung disease?"  (e.g., blood clots in lung, asthma, emphysema, birth control pills)     no 9. CAUSE: "What do you think is causing the chest pain?"     unknown 10. OTHER SYMPTOMS: "Do you have any other symptoms?" (e.g., dizziness, nausea, vomiting, sweating, fever, difficulty breathing, cough)       Vomiting 11. PREGNANCY: "Is there any chance you are pregnant?" "When was your last menstrual period?"       na  Protocols used: Chest Pain-A-AH

## 2021-06-07 NOTE — ED Provider Notes (Signed)
Emergency Medicine Provider Triage Evaluation Note  Stephen Heath , a 30 y.o. male  was evaluated in triage.  Pt complains of hematemesis and hemoptysis onset last night.  He reports he has had 2 episodes of vomiting he noticed bright red blood, and since then he has had a couple episodes of hemoptysis as well.  He reports the past 2 weeks he has had melanotic stools and reports abdominal pain as well.   Review of Systems  Positive: Hemoptysis, hematemesis, melanotic stools, abdominal pain, exertional dyspnea Negative: Fever, unintentional weight loss  Physical Exam  Pulse 64   Temp 98.3 F (36.8 C) (Oral)   Resp 16   Ht 5\' 7"  (1.702 m)   Wt 59 kg   SpO2 99%   BMI 20.36 kg/m  Gen:   Awake, no distress   Resp:  Normal effort  MSK:   Moves extremities without difficulty  Other:  Right upper quadrant and right lower quadrant abdominal tenderness present  Medical Decision Making  Medically screening exam initiated at 6:03 PM.  Appropriate orders placed.  Stephen Heath was informed that the remainder of the evaluation will be completed by another provider, this initial triage assessment does not replace that evaluation, and the importance of remaining in the ED until their evaluation is complete.     Jonetta Speak, PA-C 06/07/21 1805    06/09/21, MD 06/07/21 [phone]

## 2021-06-07 NOTE — ED Provider Notes (Signed)
[facility]-EMERGENCY DEPT Provider Note   CSN: [id number] Arrival date & time: 06/07/21  1702     History Chief Complaint  Patient presents with   Chest Pain   Hematemesis   Shortness of Breath    Stephen Heath is a 30 y.o. male presenting to the emergency department with nausea, vomiting, flecks of blood in his vomit.  He woke up with the symptoms this morning.  He said he vomited several times and noticed some twinges of blood in the vomit.  He denies fevers, chills.  He reports a mild headache.  He denies diarrhea.  He says he felt fine yesterday.  No sick contacts in the house.  He has no other medical problems.  He was admitted to the hospital for dehydration and mild rhabdo 2 months ago, but states at that time he had been outside working in the heat for long peers of time without water.  He has not done anything like that this time around.  HPI     History reviewed. No pertinent past medical history.  Patient Active Problem List   Diagnosis Date Noted   AKI (acute kidney injury) (HCC) 03/31/2021    History reviewed. No pertinent surgical history.     History reviewed. No pertinent family history.  Social History   Tobacco Use   Smoking status: Never  Vaping Use   Vaping Use: Never used  Substance Use Topics   Alcohol use: Yes   Drug use: Yes    Types: Marijuana    Home Medications Prior to Admission medications   Medication Sig Start Date End Date Taking? Authorizing Provider  famotidine (PEPCID) 20 MG tablet Take 1 tablet (20 mg total) by mouth 2 (two) times daily. Take 30 minutes before breakfast and dinner 06/07/21 07/07/21 Yes Demetria Iwai, Kermit Balo, MD  ondansetron (ZOFRAN) 4 MG tablet Take 1 tablet (4 mg total) by mouth every 8 (eight) hours as needed for up to 15 doses for nausea or vomiting. 06/07/21  Yes Terald Sleeper, MD  promethazine (PHENERGAN) 25 MG tablet Take 1 tablet (25 mg total) by mouth every 6 (six) hours as  needed for nausea or vomiting. 04/01/21   Cathren Harsh, MD    Allergies    Patient has no known allergies.  Review of Systems   Review of Systems  Constitutional:  Negative for chills and fever.  HENT:  Negative for ear pain and sore throat.   Eyes:  Negative for pain and visual disturbance.  Respiratory:  Negative for cough and shortness of breath.   Cardiovascular:  Negative for chest pain and palpitations.  Gastrointestinal:  Positive for nausea and vomiting. Negative for abdominal pain.  Genitourinary:  Negative for dysuria and hematuria.  Musculoskeletal:  Negative for arthralgias and myalgias.  Skin:  Negative for color change and rash.  Neurological:  Negative for syncope and headaches.  All other systems reviewed and are negative.  Physical Exam Updated Vital Signs BP 126/81   Pulse 66   Temp 98.3 F (36.8 C) (Oral)   Resp 18   Ht 5\' 7"  (1.702 m)   Wt 59 kg   SpO2 100%   BMI 20.36 kg/m   Physical Exam Constitutional:      General: He is not in acute distress. HENT:     Head: Normocephalic and atraumatic.  Eyes:     Conjunctiva/sclera: Conjunctivae normal.     Pupils: Pupils are equal, round, and reactive to light.  Cardiovascular:  Rate and Rhythm: Normal rate and regular rhythm.  Pulmonary:     Effort: Pulmonary effort is normal. No respiratory distress.  Abdominal:     General: There is no distension.     Tenderness: There is no abdominal tenderness.  Skin:    General: Skin is warm and dry.  Neurological:     General: No focal deficit present.     Mental Status: He is alert. Mental status is at baseline.  Psychiatric:        Mood and Affect: Mood normal.        Behavior: Behavior normal.    ED Results / Procedures / Treatments   Labs (all labs ordered are listed, but only abnormal results are displayed) Labs Reviewed  COMPREHENSIVE METABOLIC PANEL - Abnormal; Notable for the following components:      Result Value   Potassium 3.4 (*)     All other components within normal limits  RESP PANEL BY RT-PCR (FLU A&B, COVID) ARPGX2  CBC WITH DIFFERENTIAL/PLATELET  LIPASE, BLOOD    EKG None  Radiology No results found.  Procedures Procedures   Medications Ordered in ED Medications  ondansetron (ZOFRAN) injection 4 mg (has no administration in time range)    ED Course  I have reviewed the triage vital signs and the nursing notes.  Pertinent labs & imaging results that were available during my care of the patient were reviewed by me and considered in my medical decision making (see chart for details).  Suspect is likely a viral syndrome with mild hematemesis, possible Mallory-Weiss tear.  The patient is quite well-appearing.  Have extremely low suspicion for PE, is PERC negative.  Likewise I doubt Boerhaave syndrome.  He has very minimal nausea.  Is tolerating fluids here.  We gave him a dose of Zofran.  I can prescribe Zofran and Pepcid for home.  Advised conservative management for the next several days.  Okay for discharge.  I personally reviewed his prior medical records including recent hospitalization course.  I reviewed his labs today and interpreted them, hemoglobin is normal.  Labs were largely unremarkable.     Final Clinical Impression(s) / ED Diagnoses Final diagnoses:  Hematemesis, unspecified whether nausea present    Rx / DC Orders ED Discharge Orders          Ordered    ondansetron (ZOFRAN) 4 MG tablet  Every 8 hours PRN        06/07/21 2128    famotidine (PEPCID) 20 MG tablet  2 times daily        06/07/21 2128             Wyvonnia Dusky, MD 06/07/21 2129

## 2021-06-07 NOTE — ED Triage Notes (Addendum)
Patient reports that he eats and then lays down without letting his food digest. Patient states that he has been vomiting blood today. Patient states that he has been having right chest pain when he wakes up vomiting since 03/30/21.  Patient also reports when he talks, he gives "out of air."

## 2021-06-07 NOTE — Discharge Instructions (Addendum)
Please follow-up with your COVID and flu testing.  I prescribed you Zofran for nausea to take at home.  Drink water and stick to soft liquids and soft foods for the next 2 days.  If you test positive for COVID or flu should quarantine for 7 days from the start of your symptoms.

## 2022-06-25 DIAGNOSIS — F10929 Alcohol use, unspecified with intoxication, unspecified: Secondary | ICD-10-CM | POA: Insufficient documentation

## 2022-06-26 ENCOUNTER — Emergency Department (HOSPITAL_COMMUNITY)
Admission: EM | Admit: 2022-06-26 | Discharge: 2022-06-26 | Disposition: A | Discharge status: Home / Self Care | Payer: Self-pay | Attending: Emergency Medicine | Admitting: Emergency Medicine

## 2022-06-26 ENCOUNTER — Other Ambulatory Visit: Payer: Self-pay

## 2022-06-26 ENCOUNTER — Encounter (HOSPITAL_COMMUNITY): Payer: Self-pay

## 2022-06-26 DIAGNOSIS — F1020 Alcohol dependence, uncomplicated: Secondary | ICD-10-CM | POA: Diagnosis present

## 2022-06-26 DIAGNOSIS — F10229 Alcohol dependence with intoxication, unspecified: Secondary | ICD-10-CM

## 2022-06-26 LAB — CBG MONITORING, ED: Glucose-Capillary: 88 mg/dL (ref 70–99)

## 2022-06-26 LAB — RAPID URINE DRUG SCREEN, HOSP PERFORMED
Amphetamines: NOT DETECTED
Barbiturates: NOT DETECTED
Benzodiazepines: NOT DETECTED
Cocaine: POSITIVE — AB
Opiates: NOT DETECTED
Tetrahydrocannabinol: POSITIVE — AB

## 2022-06-26 LAB — CBC WITH DIFFERENTIAL/PLATELET
Abs Immature Granulocytes: 0.01 10*3/uL (ref 0.00–0.07)
Basophils Absolute: 0 10*3/uL (ref 0.0–0.1)
Basophils Relative: 0 %
Eosinophils Absolute: 0.1 10*3/uL (ref 0.0–0.5)
Eosinophils Relative: 2 %
HCT: 41.6 % (ref 39.0–52.0)
Hemoglobin: 13.9 g/dL (ref 13.0–17.0)
Immature Granulocytes: 0 %
Lymphocytes Relative: 36 %
Lymphs Abs: 1.6 10*3/uL (ref 0.7–4.0)
MCH: 32.1 pg (ref 26.0–34.0)
MCHC: 33.4 g/dL (ref 30.0–36.0)
MCV: 96.1 fL (ref 80.0–100.0)
Monocytes Absolute: 0.2 10*3/uL (ref 0.1–1.0)
Monocytes Relative: 5 %
Neutro Abs: 2.5 10*3/uL (ref 1.7–7.7)
Neutrophils Relative %: 57 %
Platelets: 199 10*3/uL (ref 150–400)
RBC: 4.33 MIL/uL (ref 4.22–5.81)
RDW: 12.5 % (ref 11.5–15.5)
WBC: 4.5 10*3/uL (ref 4.0–10.5)
nRBC: 0 % (ref 0.0–0.2)

## 2022-06-26 LAB — ACETAMINOPHEN LEVEL: Acetaminophen (Tylenol), Serum: <10 ug/mL — ABNORMAL LOW (ref 10–30)

## 2022-06-26 LAB — COMPREHENSIVE METABOLIC PANEL
ALT: 27 U/L (ref 0–44)
AST: 25 U/L (ref 15–41)
Albumin: 4.1 g/dL (ref 3.5–5.0)
Alkaline Phosphatase: 70 U/L (ref 38–126)
Anion gap: 6 (ref 5–15)
BUN: 14 mg/dL (ref 6–20)
CO2: 22 mmol/L (ref 22–32)
Calcium: 8.2 mg/dL — ABNORMAL LOW (ref 8.9–10.3)
Chloride: 113 mmol/L — ABNORMAL HIGH (ref 98–111)
Creatinine, Ser: 0.67 mg/dL (ref 0.61–1.24)
GFR, Estimated: >60 mL/min (ref >60)
Glucose, Bld: 107 mg/dL — ABNORMAL HIGH (ref 70–99)
Potassium: 3.9 mmol/L (ref 3.5–5.1)
Sodium: 141 mmol/L (ref 135–145)
Total Bilirubin: 0.2 mg/dL — ABNORMAL LOW (ref 0.3–1.2)
Total Protein: 6.9 g/dL (ref 6.5–8.1)

## 2022-06-26 LAB — ETHANOL: Alcohol, Ethyl (B): 191 mg/dL — ABNORMAL HIGH (ref <10)

## 2022-06-26 LAB — SALICYLATE LEVEL: Salicylate Lvl: <7 mg/dL — ABNORMAL LOW (ref 7.0–30.0)

## 2022-06-26 MED ORDER — THIAMINE HCL 100 MG/ML IJ SOLN: 100.0000 mg | Freq: Every day | INTRAMUSCULAR | Status: DC

## 2022-06-26 MED ORDER — ADULT MULTIVITAMIN W/MINERALS CH
1.0000 | ORAL_TABLET | Freq: Every day | ORAL | Status: DC
  Administered 2022-06-26: 1 via ORAL
  Filled 2022-06-26: qty 1

## 2022-06-26 MED ORDER — LORAZEPAM 2 MG/ML IJ SOLN: 1.0000 mg | INTRAMUSCULAR | Status: DC | PRN

## 2022-06-26 MED ORDER — ONDANSETRON 4 MG PO TBDP
4.0000 mg | ORAL_TABLET | Freq: Once | ORAL | Status: AC
  Administered 2022-06-26: 4 mg via ORAL
  Filled 2022-06-26: qty 1

## 2022-06-26 MED ORDER — FOLIC ACID 1 MG PO TABS
1.0000 mg | ORAL_TABLET | Freq: Every day | ORAL | Status: DC
  Administered 2022-06-26: 1 mg via ORAL
  Filled 2022-06-26: qty 1

## 2022-06-26 MED ORDER — THIAMINE MONONITRATE 100 MG PO TABS
100.0000 mg | ORAL_TABLET | Freq: Every day | ORAL | Status: DC
  Administered 2022-06-26: 100 mg via ORAL
  Filled 2022-06-26: qty 1

## 2022-06-26 MED ORDER — LORAZEPAM 1 MG PO TABS: 1.0000 mg | ORAL_TABLET | ORAL | Status: DC | PRN

## 2022-06-26 NOTE — Discharge Summary (Signed)
[facility] Psych ED Discharge  06/26/2022 12:46 PM Stephen Heath  MRN:  [id number]  Principal Problem: Alcohol use disorder, severe, dependence (HCC) Discharge Diagnoses: Principal Problem:   Alcohol use disorder, severe, dependence (HCC)  Clinical Impression:  Final diagnoses:  Alcohol use with uncomplicated intoxication with moderate or severe use disorder (HCC)   Subjective:  Stephen Heath is a 31 y.o. male with a history of alcohol abuse who presented to [facility] on 06/25/2022 after a bystander reportedly observed him "fall out."  Patient reports that the someone saw him "pass out" and called 911. He states that he drinks approximately 4 large coronas most days of the week. He denies a history of withdrawal seizures and delirium tremens. He states that he also uses marijuana daily. Denies use of other illicit substances. BAL 191. UDS +cocaine, +THC.  Patient denies thoughts of wanting to kill his step-father. He does acknowledge thoughts of wanting to "punch" his stepfather. But adamantly denies thoughts of wanting to kill or seriously harm his stepfather. He denies suicidal ideations. He denies a history of suicide attempts. He denies a history of inpatient psychiatric  admissions. He denies a history of being treated for mental health disorders and substance abuse. He reports that he lives in [place] with family. States that he is employed in the [employer]. He states that he plans to go stay with his girlfriend when discharged.   On evaluation patient is alert and oriented x 4, pleasant, and cooperative. Speech is clear and coherent. Mood is euthymic and affect is congruent with mood. Laughs appropriately at times. Thought process is coherent and thought content is logical. Denies auditory and visual hallucinations. No indication that patient is responding to internal stimuli. No evidence of delusional thought content. Denies suicidal ideations. Denies homicidal ideations.    ED Assessment  Time Calculation: Start Time: 1140 Stop Time: 1210 Total Time in Minutes (Assessment Completion): 30   Past Psychiatric History: Alcohol use disorder. Denies a history of inpatient psychiatric admissions. Denies a history of substance abuse treatment.    Risk to Self or Others: Is the patient at risk to self? No Has the patient been a risk to self in the past 6 months? No Has the patient been a risk to self within the distant past? No Is the patient a risk to others? No Has the patient been a risk to others in the past 6 months? No Has the patient been a risk to others within the distant past? No  Past Medical History: History reviewed. No pertinent past medical history. History reviewed. No pertinent surgical history. Family History: History reviewed. No pertinent family history. Social History:  Social History   Substance and Sexual Activity  Alcohol Use None     Social History   Substance and Sexual Activity  Drug Use Not on file    Social History   Socioeconomic History   Marital status: Single    Spouse name: Not on file   Number of children: Not on file   Years of education: Not on file   Highest education level: Not on file  Occupational History   Not on file  Tobacco Use   Smoking status: Unknown   Smokeless tobacco: Not on file  Substance and Sexual Activity   Alcohol use: Not on file   Drug use: Not on file   Sexual activity: Not on file  Other Topics Concern   Not on file  Social History Narrative   Not on file  Social Determinants of Health   Financial Resource Strain: Not on file  Food Insecurity: Not on file  Transportation Needs: Not on file  Physical Activity: Not on file  Stress: Not on file  Social Connections: Not on file    Tobacco Cessation:  A prescription for an FDA-approved tobacco cessation medication was offered at discharge and the patient refused  Current Medications: Current Facility-Administered Medications  Medication  Dose Route Frequency Provider Last Rate Last Admin   folic acid (FOLVITE) tablet 1 mg  1 mg Oral Daily Michelle Piper, PA-C   1 mg at 06/26/22 1011   LORazepam (ATIVAN) tablet 1-4 mg  1-4 mg Oral Q1H PRN Michelle Piper, PA-C       Or   LORazepam (ATIVAN) injection 1-4 mg  1-4 mg Intravenous Q1H PRN Schutt, Alexander M, PA-C       multivitamin with minerals tablet 1 tablet  1 tablet Oral Daily Michelle Piper, PA-C   1 tablet at 06/26/22 1010   thiamine (VITAMIN B1) tablet 100 mg  100 mg Oral Daily Michelle Piper, PA-C   100 mg at 06/26/22 1011   Or   thiamine (VITAMIN B1) injection 100 mg  100 mg Intravenous Daily Schutt, Edsel Petrin, PA-C       No current outpatient medications on file.   PTA Medications: (Not in a hospital admission)   [place] Scale:  Flowsheet Row ED from 06/26/2022 in [facility]GENCY DEPT  C-SSRS RISK CATEGORY No Risk       Musculoskeletal: Strength & Muscle Tone: within normal limits Gait & Station: normal Patient leans: N/A  Psychiatric Specialty Exam: Presentation  General Appearance:  Casual  Eye Contact: Good  Speech: Clear and Coherent; Normal Rate  Speech Volume: Normal  Handedness: Right   Mood and Affect  Mood: Euthymic  Affect: Congruent   Thought Process  Thought Processes: Coherent; Linear  Descriptions of Associations:Intact  Orientation:Full (Time, Place and Person)  Thought Content:Logical  History of Schizophrenia/Schizoaffective disorder:No data recorded Duration of Psychotic Symptoms:No data recorded Hallucinations:Hallucinations: None  Ideas of Reference:None  Suicidal Thoughts:Suicidal Thoughts: No  Homicidal Thoughts:Homicidal Thoughts: No   Sensorium  Memory: Immediate Good; Recent Good  Judgment: Fair  Insight: Present   Executive Functions  Concentration: Good  Attention Span: Good  Recall: Good  Fund of  Knowledge: Good  Language: Good   Psychomotor Activity  Psychomotor Activity: Psychomotor Activity: Normal   Assets  Assets: Physical Health; Housing   Sleep  Sleep: Sleep: Good    Physical Exam: Physical Exam Vitals and nursing note reviewed.  Constitutional:      General: He is not in acute distress.    Appearance: He is not ill-appearing, toxic-appearing or diaphoretic.  Eyes:     General:        Right eye: No discharge.        Left eye: No discharge.  Cardiovascular:     Rate and Rhythm: Normal rate.  Pulmonary:     Effort: Pulmonary effort is normal. No respiratory distress.  Musculoskeletal:        General: Normal range of motion.     Cervical back: Normal range of motion.  Neurological:     Mental Status: He is alert and oriented to person, place, and time.  Psychiatric:        Speech: Speech normal.        Behavior: Behavior is cooperative.        Thought Content: Thought  content is not paranoid or delusional. Thought content does not include homicidal or suicidal ideation.    Review of Systems  Constitutional:  Negative for diaphoresis.  Respiratory:  Negative for cough and shortness of breath.   Cardiovascular:  Negative for chest pain.  Gastrointestinal:  Negative for diarrhea, nausea and vomiting.  Neurological:  Negative for tremors.  Psychiatric/Behavioral:  Positive for substance abuse. Negative for hallucinations and suicidal ideas.     Blood pressure (!) 151/109, pulse 75, temperature 97.8 F (36.6 C), temperature source Oral, resp. rate 16, height 5\' 7"  (1.702 m), weight 63.5 kg, SpO2 100 %. Body mass index is 21.93 kg/m.   Demographic Factors:  Male  Loss Factors: NA  Historical Factors: Family history of mental illness or substance abuse  Risk Reduction Factors:   Sense of responsibility to family, Religious beliefs about death, Employed, and Living with another person, especially a relative  Continued Clinical Symptoms:   Alcohol/Substance Abuse/Dependencies  Cognitive Features That Contribute To Risk:  Closed-mindedness    Suicide Risk:  Minimal: No identifiable suicidal ideation.  Patients presenting with no risk factors but with morbid ruminations; may be classified as minimal risk based on the severity of the depressive symptoms   Follow-up Information     [facility] Follow up.   Specialty: Urgent Care Why: As needed, If symptoms worsen Contact information: [address] [place] [phone]                Medical Decision Making: At time of discharge, patient denies SI, HI, AVH and is able to contract for safety. He demonstrated no overt evidence of psychosis or mania. Prior to discharge, Stephen Heath verbalized that he understood warning signs, triggers, and symptoms of worsening mental health and how to access emergency mental health care if they felt it was needed. Patient was instructed to call 911 or return to the emergency room if they experienced any concerning symptoms after discharge. Patient voiced understanding and agreed to the above.   Discussed resources available for substance abuse treatment. Patient verbalized understanding. He stated that he is not currently in treatment for substance abuse. Resources have been attached to his AVS.     Disposition: No evidence of imminent risk to self or others at present.   Patient does not meet criteria for psychiatric inpatient admission. Supportive therapy provided about ongoing stressors. Discussed crisis plan, support from social network, calling 911, coming to the Emergency Department, and calling Suicide Hotline.     Discharge Instructions       Discharge recommendations:  Patient is to take medications as prescribed. Please see information for follow-up appointment with psychiatry and therapy. Please follow up with your primary care provider for all medical related needs.    Substance Abuse Treatment: We recommend that the patient receive treatment for alcohol use disorder. Please see attached resources.   Therapy: We recommend that patient participate in individual therapy to address mental health concerns.  Medications: The patient or guardian is to contact a medical professional and/or outpatient provider to address any new side effects that develop. The patient or guardian should update outpatient providers of any new medications and/or medication changes.   Atypical antipsychotics: If you are prescribed an atypical antipsychotic, it is recommended that your height, weight, BMI, blood pressure, fasting lipid panel, and fasting blood sugar be monitored by your outpatient providers.  Safety:  The patient should abstain from use of illicit substances/drugs and abuse of any medications.  If symptoms worsen or do not continue to improve or if the patient becomes actively suicidal or homicidal then it is recommended that the patient return to the closest hospital emergency department, [facility], or call 911 for further evaluation and treatment. National Suicide Prevention Lifeline 1-800-SUICIDE or (732)553-76811-934 294 8641.  About 988 988 offers 24/7 access to trained crisis counselors who can help people experiencing mental health-related distress. People can call or text 988 or chat 988lifeline.org for themselves or if they are worried about a loved one who may need crisis support.  Crisis Mobile: [facility] [facility]:                     [phone] (for crisis response 24 hours a day) [facility] Hotline:                                            724-200-78551-934-449-0267       Nira ConnJason Arnika Larzelere, APRN, FNP-C, PMHNP-BC [facility]  06/26/2022, 12:46 PM

## 2022-06-26 NOTE — ED Provider Notes (Signed)
I provided a substantive portion of the care of this patient.  I personally performed the entirety of the medical decision making for this encounter. {Remember to document shared critical care using "edcritical" dot phrase:1}

## 2022-06-27 ENCOUNTER — Encounter (HOSPITAL_COMMUNITY): Payer: Self-pay

## 2023-03-01 IMAGING — CR DG ABDOMEN ACUTE W/ 1V CHEST
3 series · 3 of 3 positions shown · non-contrast
Comparison: Chest radiograph 08/03/2014 clinical correlation is
also made with 08/07/2019 CT

CLINICAL DATA: Pain, vomiting

EXAM:
DG ABDOMEN ACUTE WITH 1 VIEW CHEST

[w chest pa]
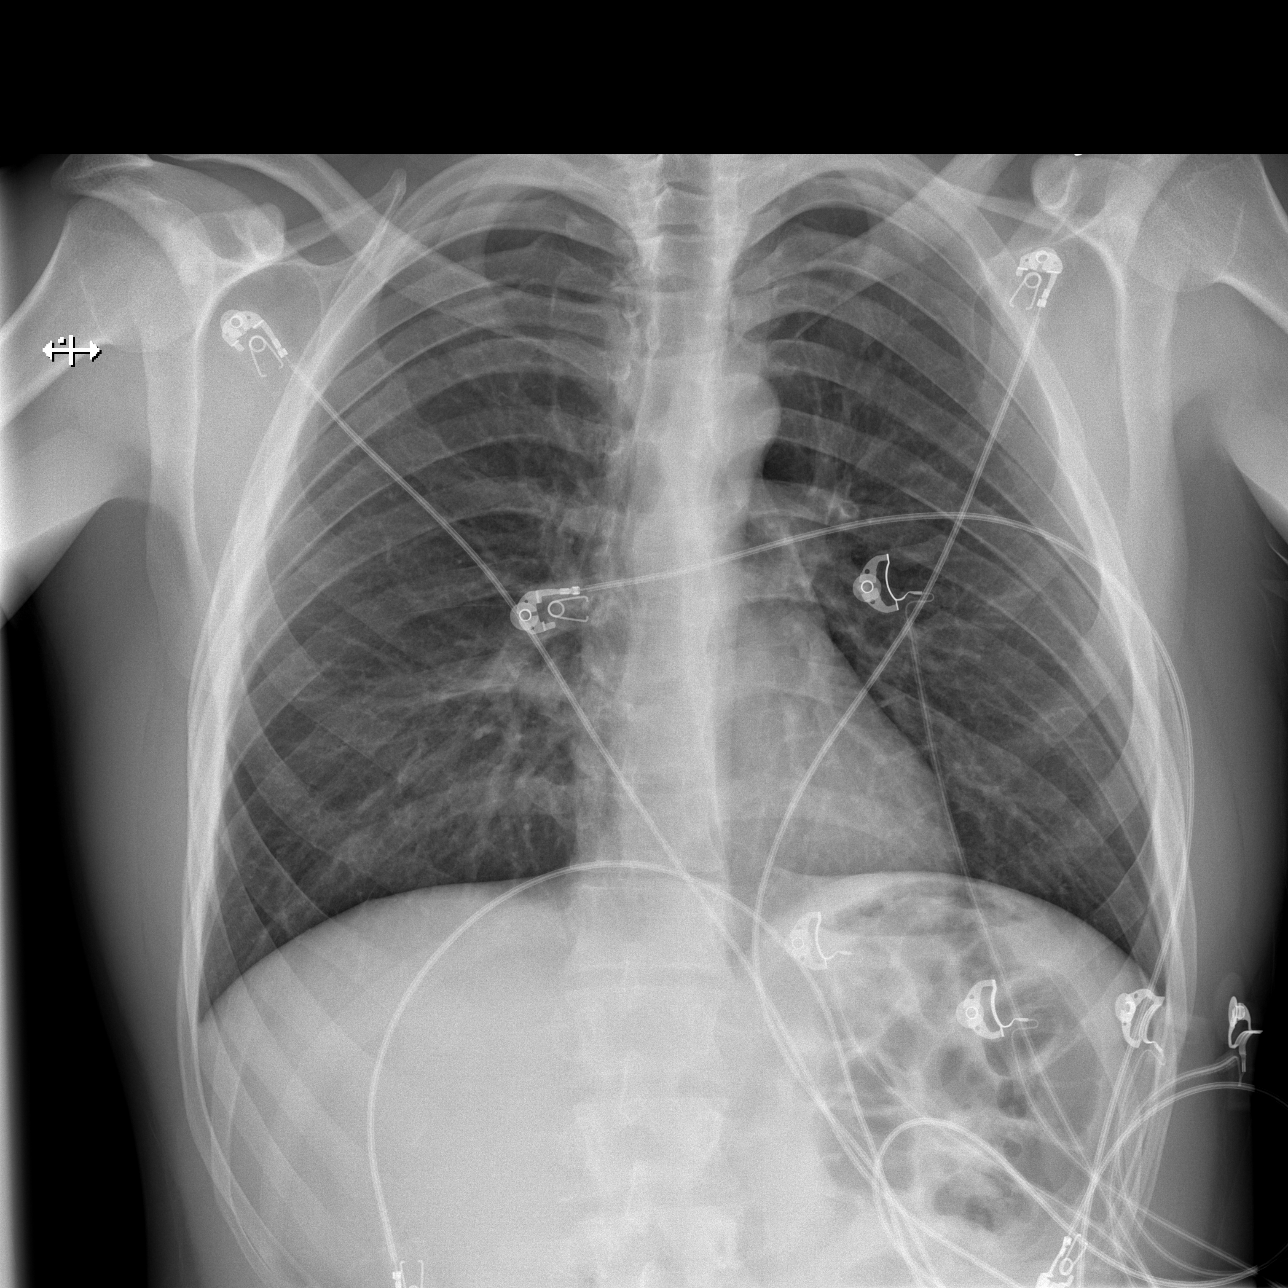

[w abdomen upright]
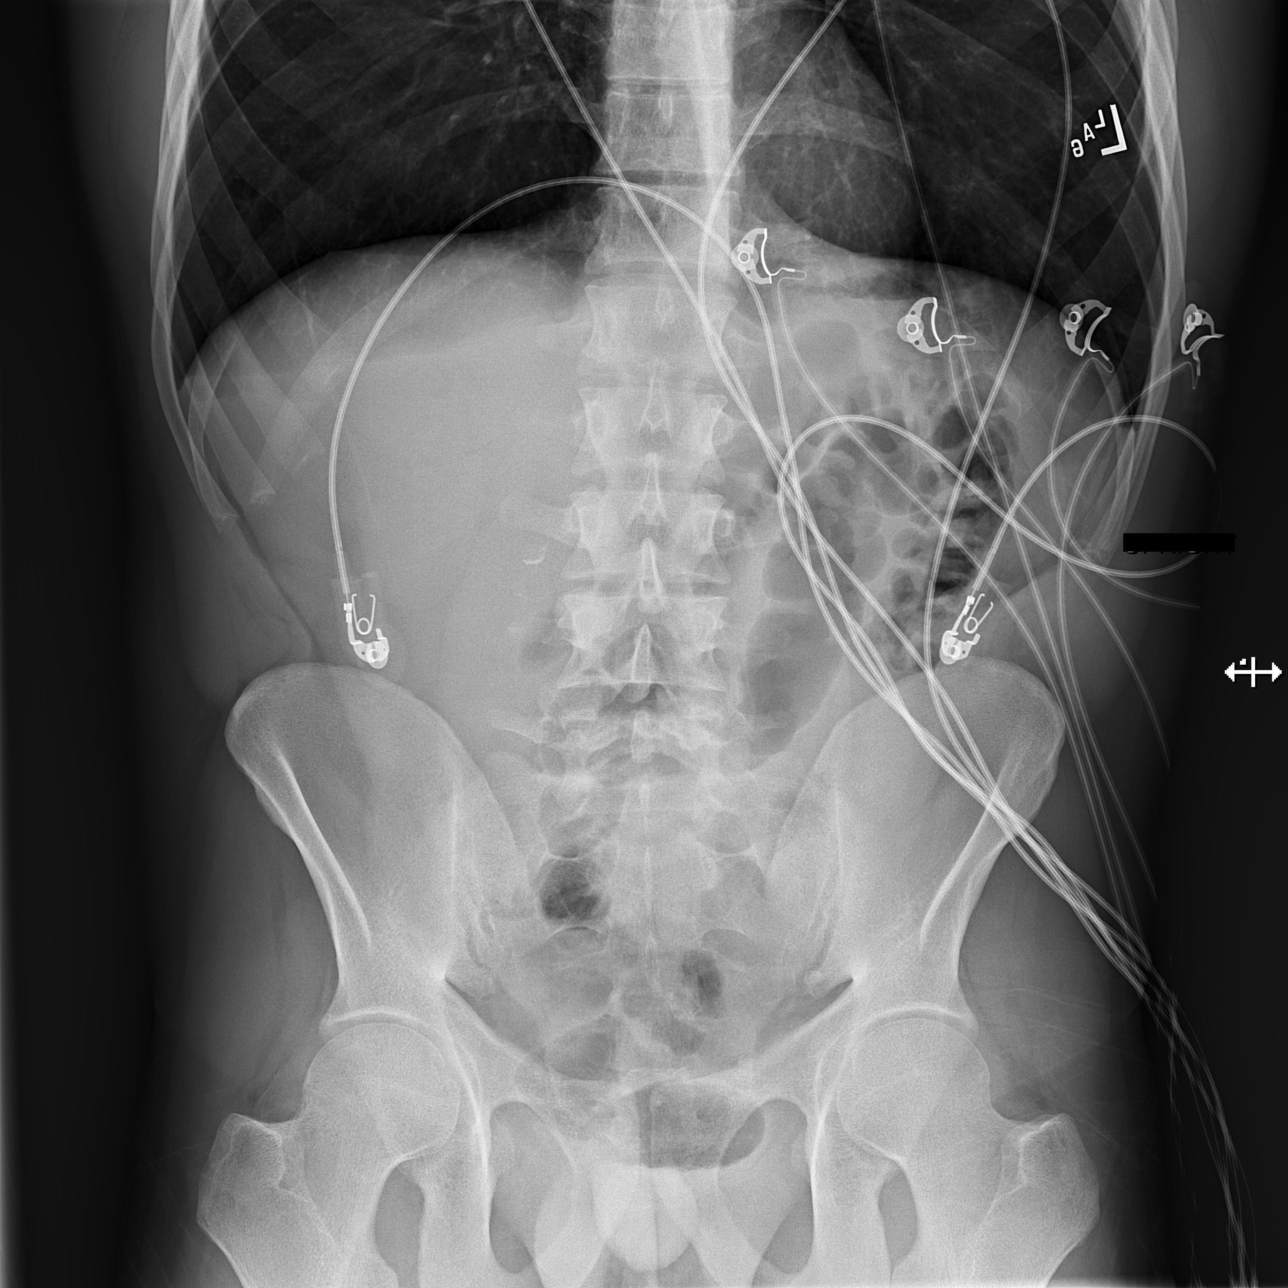

[x abdomen supine]
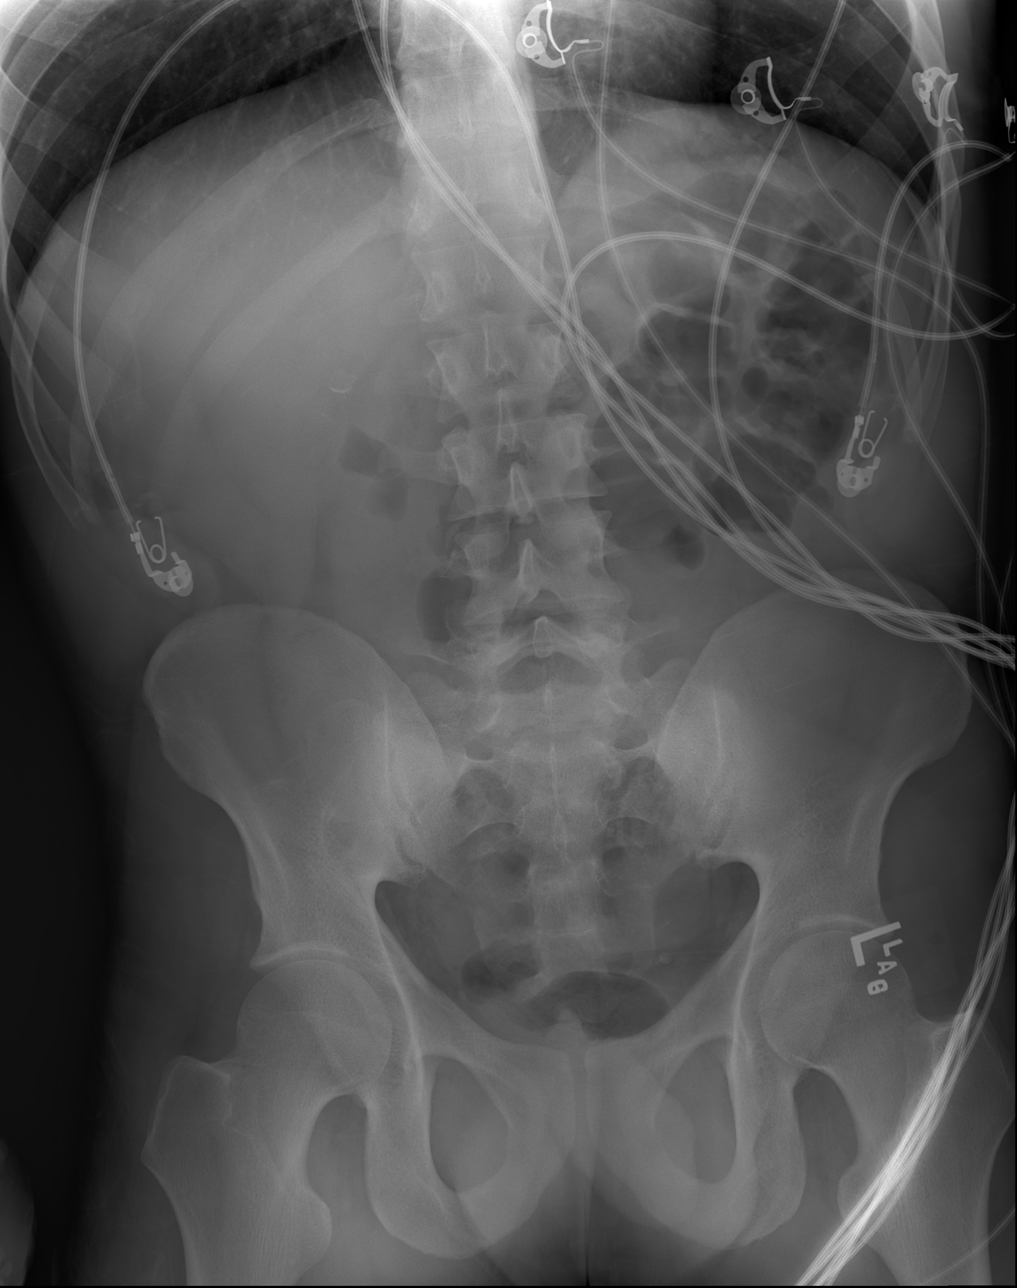

[3 of 3 positions shown; findings below may reference images not displayed]

FINDINGS: There is no evidence of dilated bowel loops or free intraperitoneal
air. Calcification inferior to the liver and to the right of
midline, likely cholelithiasis as seen on the 08/07/2019 CT.

Heart size and mediastinal contours are within normal limits. Both
lungs are clear.
IMPRESSION: Negative abdominal radiographs.  No acute cardiopulmonary disease.

## 2023-04-24 ENCOUNTER — Encounter (HOSPITAL_COMMUNITY): Payer: Self-pay | Admitting: Emergency Medicine

## 2023-04-24 ENCOUNTER — Emergency Department (HOSPITAL_COMMUNITY): Payer: Self-pay

## 2023-04-24 ENCOUNTER — Emergency Department (HOSPITAL_COMMUNITY)
Admission: EM | Admit: 2023-04-24 | Discharge: 2023-04-24 | Disposition: A | Discharge status: Home / Self Care | Payer: Self-pay | Attending: Emergency Medicine | Admitting: Emergency Medicine

## 2023-04-24 ENCOUNTER — Other Ambulatory Visit: Payer: Self-pay

## 2023-04-24 DIAGNOSIS — J181 Lobar pneumonia, unspecified organism: Secondary | ICD-10-CM | POA: Insufficient documentation

## 2023-04-24 DIAGNOSIS — J189 Pneumonia, unspecified organism: Secondary | ICD-10-CM

## 2023-04-24 DIAGNOSIS — Z20822 Contact with and (suspected) exposure to covid-19: Secondary | ICD-10-CM | POA: Insufficient documentation

## 2023-04-24 LAB — COMPREHENSIVE METABOLIC PANEL
ALT: 39 U/L (ref 0–44)
AST: 31 U/L (ref 15–41)
Albumin: 3.5 g/dL (ref 3.5–5.0)
Alkaline Phosphatase: 71 U/L (ref 38–126)
Anion gap: 10 (ref 5–15)
BUN: 12 mg/dL (ref 6–20)
CO2: 21 mmol/L — ABNORMAL LOW (ref 22–32)
Calcium: 9 mg/dL (ref 8.9–10.3)
Chloride: 108 mmol/L (ref 98–111)
Creatinine, Ser: 0.76 mg/dL (ref 0.61–1.24)
GFR, Estimated: >60 mL/min (ref >60)
Glucose, Bld: 131 mg/dL — ABNORMAL HIGH (ref 70–99)
Potassium: 4 mmol/L (ref 3.5–5.1)
Sodium: 139 mmol/L (ref 135–145)
Total Bilirubin: 0.6 mg/dL (ref 0.3–1.2)
Total Protein: 6.1 g/dL — ABNORMAL LOW (ref 6.5–8.1)

## 2023-04-24 LAB — CBC WITH DIFFERENTIAL/PLATELET
Abs Immature Granulocytes: 0.04 10*3/uL (ref 0.00–0.07)
Basophils Absolute: 0 10*3/uL (ref 0.0–0.1)
Basophils Relative: 0 %
Eosinophils Absolute: 0.1 10*3/uL (ref 0.0–0.5)
Eosinophils Relative: 2 %
HCT: 42.9 % (ref 39.0–52.0)
Hemoglobin: 13.9 g/dL (ref 13.0–17.0)
Immature Granulocytes: 1 %
Lymphocytes Relative: 27 %
Lymphs Abs: 2 10*3/uL (ref 0.7–4.0)
MCH: 31 pg (ref 26.0–34.0)
MCHC: 32.4 g/dL (ref 30.0–36.0)
MCV: 95.8 fL (ref 80.0–100.0)
Monocytes Absolute: 0.3 10*3/uL (ref 0.1–1.0)
Monocytes Relative: 4 %
Neutro Abs: 4.9 10*3/uL (ref 1.7–7.7)
Neutrophils Relative %: 66 %
Platelets: 295 10*3/uL (ref 150–400)
RBC: 4.48 MIL/uL (ref 4.22–5.81)
RDW: 12.4 % (ref 11.5–15.5)
WBC: 7.4 10*3/uL (ref 4.0–10.5)
nRBC: 0 % (ref 0.0–0.2)

## 2023-04-24 LAB — RESP PANEL BY RT-PCR (RSV, FLU A&B, COVID)  RVPGX2
Influenza A by PCR: NEGATIVE
Influenza B by PCR: NEGATIVE
Resp Syncytial Virus by PCR: NEGATIVE
SARS Coronavirus 2 by RT PCR: NEGATIVE

## 2023-04-24 LAB — MONONUCLEOSIS SCREEN: Mono Screen: NEGATIVE

## 2023-04-24 LAB — LIPASE, BLOOD: Lipase: 29 U/L (ref 11–51)

## 2023-04-24 MED ORDER — DOXYCYCLINE HYCLATE 100 MG PO CAPS: 100.0000 mg | ORAL_CAPSULE | Freq: Two times a day (BID) | ORAL | 0 refills | Status: AC

## 2023-04-24 MED ORDER — DOXYCYCLINE HYCLATE 100 MG PO TABS
100.0000 mg | ORAL_TABLET | Freq: Once | ORAL | Status: AC
  Administered 2023-04-24: 100 mg via ORAL
  Filled 2023-04-24: qty 1

## 2023-04-24 NOTE — ED Provider Notes (Signed)
[facility]-EMERGENCY DEPT [facility] Emergency Department Provider Note MRN:  [id number]  Arrival date & time: 04/24/23     Chief Complaint   Generalized Body Aches, Fever, and Chills   History of Present Illness   Stephen Heath is a 32 y.o. year-old male presents to the ED with chief complaint of cough, with associated fevers and chills.  States that this has been ongoing for over a week, but he hadn't had time to be evaluated.  States that when he woke up this morning drenched in sweat, he decided to be evaluated.  He states that sometimes the cough is productive, but not always.  He denies any other associated symtpoms.  History provided by patient.   Review of Systems  Pertinent positive and negative review of systems noted in HPI.    Physical Exam   Vitals:   04/24/23 1717 04/24/23 2228  BP: 117/65 117/72  Pulse: 61 (!) 49  Resp: 16 18  Temp: 98.2 F (36.8 C) 98.3 F (36.8 C)  SpO2: 99% 100%    CONSTITUTIONAL:  non toxic-appearing, NAD NEURO:  Alert and oriented x 3, CN 3-12 grossly intact EYES:  eyes equal and reactive ENT/NECK:  Supple, no stridor  CARDIO:  normal rate on my exam, regular rhythm, appears well-perfused  PULM:  No respiratory distress, CTAB GI/GU:  non-distended,  MSK/SPINE:  No gross deformities, no edema, moves all extremities  SKIN:  no rash, atraumatic   *Additional and/or pertinent findings included in MDM below  Diagnostic and Interventional Summary    EKG Interpretation Date/Time:    Ventricular Rate:    PR Interval:    QRS Duration:    QT Interval:    QTC Calculation:   R Axis:      Text Interpretation:         Labs Reviewed  COMPREHENSIVE METABOLIC PANEL - Abnormal; Notable for the following components:      Result Value   CO2 21 (*)    Glucose, Bld 131 (*)    Total Protein 6.1 (*)    All other components within normal limits  RESP PANEL BY RT-PCR (RSV, FLU A&B, COVID)  RVPGX2  LIPASE, BLOOD  CBC WITH  DIFFERENTIAL/PLATELET  MONONUCLEOSIS SCREEN    DG Chest 2 View  Final Result      Medications  doxycycline (VIBRA-TABS) tablet 100 mg (has no administration in time range)     Procedures  /  Critical Care Procedures  ED Course and Medical Decision Making  I have reviewed the triage vital signs, the nursing notes, and pertinent available records from the EMR.  Social Determinants Affecting Complexity of Care: Patient has no clinically significant social determinants affecting this chief complaint..   ED Course:    Medical Decision Making Patient here with cough and subjective fevers and chills.  Labs and imaging done in triage.  CXR worrisome for pneumonia.  He's not toxic.  Vitals are reassuring.  He appears appropriate for outpatient treatment.  Problems Addressed: Community acquired pneumonia of left lower lobe of lung: acute illness or injury    Details: Will treat with Doxy  Amount and/or Complexity of Data Reviewed Radiology: ordered and independent interpretation performed.    Details: Questionable pneumonia  Risk Prescription drug management.         Consultants: No consultations were needed in caring for this patient.   Treatment and Plan: Emergency department workup does not suggest an emergent condition requiring admission or immediate intervention beyond  what has been  performed at this time. The patient is safe for discharge and has  been instructed to return immediately for worsening symptoms, change in  symptoms or any other concerns    Final Clinical Impressions(s) / ED Diagnoses     ICD-10-CM   1. Community acquired pneumonia of left lower lobe of lung  J18.9       ED Discharge Orders          Ordered    doxycycline (VIBRAMYCIN) 100 MG capsule  2 times daily        04/24/23 2328              Discharge Instructions Discussed with and Provided to Patient:   Discharge Instructions   None      Roxy Horseman,  PA-C 04/24/23 2329    Gloris Manchester, MD 04/26/23 [phone]

## 2023-04-24 NOTE — ED Notes (Signed)
Patient walked out prior to getting vitals.

## 2023-04-24 NOTE — ED Triage Notes (Addendum)
Pt here POV for body aches, fever, chills, and a cough x 2 months. States he wakes up in the morning covered in sweat. Reports nausea and vomiting and poor appetite x 2 weeks. Denies diarrhea.

## 2023-04-24 NOTE — ED Provider Triage Note (Signed)
Emergency Medicine Provider Triage Evaluation Note  Kolter Reaver , a 32 y.o. male  was evaluated in triage.  Pt complains of generalized body aches, fever, cough, chills for the past 2 months.  Patient states he wakes up in the morning covered in sweat.  Reports nausea, vomiting, and poor appetite for the past 2 weeks.    Review of Systems  Positive: As above Negative: As above  Physical Exam  BP 117/65 (BP Location: Right Arm)   Pulse 61   Temp 98.2 F (36.8 C)   Resp 16   Ht 5\' 7"  (1.702 m)   Wt 63.5 kg   SpO2 99%   BMI 21.93 kg/m  Gen:   Awake, no distress   Resp:  Normal effort  MSK:   Moves extremities without difficulty  Other:    Medical Decision Making  Medically screening exam initiated at 5:22 PM.  Appropriate orders placed.  Pavlos Palacio-Garcia was informed that the remainder of the evaluation will be completed by another provider, this initial triage assessment does not replace that evaluation, and the importance of remaining in the ED until their evaluation is complete.     Lenard Simmer, PA-C 04/24/23 1724

## 2024-01-21 ENCOUNTER — Encounter (HOSPITAL_COMMUNITY): Payer: Self-pay | Admitting: *Deleted

## 2024-01-21 ENCOUNTER — Other Ambulatory Visit: Payer: Self-pay

## 2024-01-21 ENCOUNTER — Emergency Department (HOSPITAL_COMMUNITY)
Admission: EM | Admit: 2024-01-21 | Discharge: 2024-01-21 | Disposition: A | Discharge status: Home / Self Care | Payer: Self-pay | Attending: Emergency Medicine | Admitting: Emergency Medicine

## 2024-01-21 DIAGNOSIS — W52XXXA Crushed, pushed or stepped on by crowd or human stampede, initial encounter: Secondary | ICD-10-CM | POA: Diagnosis not present

## 2024-01-21 DIAGNOSIS — S99921A Unspecified injury of right foot, initial encounter: Secondary | ICD-10-CM | POA: Diagnosis present

## 2024-01-21 DIAGNOSIS — T148XXA Other injury of unspecified body region, initial encounter: Secondary | ICD-10-CM

## 2024-01-21 DIAGNOSIS — S91331A Puncture wound without foreign body, right foot, initial encounter: Secondary | ICD-10-CM | POA: Diagnosis not present

## 2024-01-21 MED ORDER — CEPHALEXIN 500 MG PO CAPS: 500.0000 mg | ORAL_CAPSULE | Freq: Three times a day (TID) | ORAL | 0 refills | Status: AC

## 2024-01-21 NOTE — ED Provider Notes (Signed)
 [place] EMERGENCY DEPARTMENT AT [facility] Provider Note   CSN: [id number] Arrival date & time: 01/21/24  1720     Patient presents with: Foot Pain   Stephen Heath is a 33 y.o. male.   33 year old male presents today for concern of puncture wound to the right foot.  This occurred yesterday.  Tetanus shot was updated yesterday at [facility].  Denies any worsening symptoms.  He states he has return to work and wants to get rid of the inflammation.  Patient states he was removing some trash when he stepped on a piece of wood with nails sticking out.  The history is provided by the patient. No language interpreter was used.       Prior to Admission medications   Medication Sig Start Date End Date Taking? Authorizing Provider  cephALEXin  (KEFLEX ) 500 MG capsule Take 1 capsule (500 mg total) by mouth 3 (three) times daily for 7 days. 01/21/24 01/28/24 Yes Kristine Chahal, PA-C  doxycycline  (VIBRAMYCIN ) 100 MG capsule Take 1 capsule (100 mg total) by mouth 2 (two) times daily. 04/24/23   Vicky Charleston, PA-C  famotidine  (PEPCID ) 20 MG tablet Take 1 tablet (20 mg total) by mouth 2 (two) times daily. Take 30 minutes before breakfast and dinner 06/07/21 07/07/21  Cottie Donnice PARAS, MD  ondansetron  (ZOFRAN ) 4 MG tablet Take 1 tablet (4 mg total) by mouth every 8 (eight) hours as needed for up to 15 doses for nausea or vomiting. 06/07/21   Cottie Donnice PARAS, MD  promethazine  (PHENERGAN ) 25 MG tablet Take 1 tablet (25 mg total) by mouth every 6 (six) hours as needed for nausea or vomiting. 04/01/21   Rai, Nydia POUR, MD    Allergies: Patient has no known allergies.    Review of Systems  Skin:  Positive for wound.    Updated Vital Signs BP 119/65 (BP Location: Right Arm)   Pulse 84   Temp 98.4 F (36.9 C)   Resp 14   Ht 5' 7 (1.702 m)   Wt 63.5 kg   SpO2 99%   BMI 21.93 kg/m   Physical Exam Vitals and nursing note reviewed.  Constitutional:      General: He is not in  acute distress.    Appearance: Normal appearance. He is not ill-appearing.  HENT:     Head: Normocephalic and atraumatic.     Nose: Nose normal.  Eyes:     Conjunctiva/sclera: Conjunctivae normal.  Cardiovascular:     Rate and Rhythm: Normal rate.  Pulmonary:     Effort: Pulmonary effort is normal. No respiratory distress.  Musculoskeletal:        General: No deformity.  Skin:    Findings: No rash.     Comments: Puncture wound noted to the plantar aspect of the right foot.  No drainage, no bleeding.  No surrounding inflammation such as erythema or induration.  Neurovascularly intact.  Neurological:     Mental Status: He is alert.     (all labs ordered are listed, but only abnormal results are displayed) Labs Reviewed - No data to display  EKG: None  Radiology: No results found.   Procedures   Medications Ordered in the ED - No data to display                                  Medical Decision Making Risk Prescription drug management.   33 year old male presents today  for concern of puncture wound.  No signs of infection.  Will prescribe Keflex  as he is returning back to work and he would like something to help keep this from getting worse.  His tetanus shot was updated yesterday.  Discharged in stable condition.  Return precaution discussed.  Patient voices understanding and is in agreement with plan.   Final diagnoses:  Puncture wound    ED Discharge Orders          Ordered    cephALEXin  (KEFLEX ) 500 MG capsule  3 times daily        01/21/24 1741               Hildegard Loge, PA-C 01/21/24 1749    Jerrol Agent, MD 01/21/24 1820

## 2024-01-21 NOTE — ED Triage Notes (Signed)
 The pt stepped on a stake that went through his shoe yesterday  he is c/o pain

## 2024-01-21 NOTE — ED Triage Notes (Signed)
 The pt had a tetanus shot yesterday at [facility] ????

## 2024-01-21 NOTE — Discharge Instructions (Signed)
 Antibiotic sent into the pharmacy.  No concerning findings on exam.  You stated your tetanus shot was updated yesterday.  Return for any concerning symptoms.  Establish a primary care doctor.
